# Patient Record
Sex: Female | Born: 1965 | Race: White | Hispanic: No | Smoking: Former smoker
Health system: Southern US, Community
[De-identification: ages and names within clinical notes are randomized; demographics above are authoritative.]

## PROBLEM LIST (undated history)

## (undated) DIAGNOSIS — E785 Hyperlipidemia, unspecified: Secondary | ICD-10-CM

## (undated) DIAGNOSIS — F324 Major depressive disorder, single episode, in partial remission: Secondary | ICD-10-CM

## (undated) DIAGNOSIS — T7840XA Allergy, unspecified, initial encounter: Secondary | ICD-10-CM

## (undated) DIAGNOSIS — F419 Anxiety disorder, unspecified: Secondary | ICD-10-CM

## (undated) DIAGNOSIS — J45909 Unspecified asthma, uncomplicated: Secondary | ICD-10-CM

## (undated) DIAGNOSIS — F32A Depression, unspecified: Secondary | ICD-10-CM

## (undated) DIAGNOSIS — F329 Major depressive disorder, single episode, unspecified: Secondary | ICD-10-CM

## (undated) DIAGNOSIS — I10 Essential (primary) hypertension: Secondary | ICD-10-CM

## (undated) DIAGNOSIS — E119 Type 2 diabetes mellitus without complications: Secondary | ICD-10-CM

## (undated) HISTORY — PX: TOTAL ABDOMINAL HYSTERECTOMY: SHX209

## (undated) HISTORY — DX: Allergy, unspecified, initial encounter: T78.40XA

## (undated) HISTORY — PX: ABDOMINAL HYSTERECTOMY: SHX81

## (undated) HISTORY — PX: OOPHORECTOMY: SHX86

---

## 2011-07-05 LAB — HM COLONOSCOPY

## 2015-06-23 LAB — LIPID PANEL
Cholesterol: 172 (ref 0–200)
HDL: 50 (ref 35–70)
LDL CALC: 92
TRIGLYCERIDES: 152 (ref 40–160)

## 2017-10-18 ENCOUNTER — Encounter: Payer: Self-pay | Admitting: Emergency Medicine

## 2017-10-18 ENCOUNTER — Other Ambulatory Visit: Payer: Self-pay

## 2017-10-18 ENCOUNTER — Ambulatory Visit
Admission: EM | Admit: 2017-10-18 | Discharge: 2017-10-18 | Disposition: A | Discharge status: Home / Self Care | Payer: Self-pay | Attending: Family Medicine | Admitting: Family Medicine

## 2017-10-18 DIAGNOSIS — R3915 Urgency of urination: Secondary | ICD-10-CM

## 2017-10-18 DIAGNOSIS — N3001 Acute cystitis with hematuria: Secondary | ICD-10-CM

## 2017-10-18 DIAGNOSIS — R319 Hematuria, unspecified: Secondary | ICD-10-CM

## 2017-10-18 HISTORY — DX: Hyperlipidemia, unspecified: E78.5

## 2017-10-18 HISTORY — DX: Major depressive disorder, single episode, unspecified: F32.9

## 2017-10-18 HISTORY — DX: Essential (primary) hypertension: I10

## 2017-10-18 HISTORY — DX: Type 2 diabetes mellitus without complications: E11.9

## 2017-10-18 HISTORY — DX: Anxiety disorder, unspecified: F41.9

## 2017-10-18 HISTORY — DX: Depression, unspecified: F32.A

## 2017-10-18 LAB — URINALYSIS, COMPLETE (UACMP) WITH MICROSCOPIC
BILIRUBIN URINE: NEGATIVE
GLUCOSE, UA: NEGATIVE mg/dL
LEUKOCYTES UA: NEGATIVE
NITRITE: NEGATIVE
PH: 5.5 (ref 5.0–8.0)
Protein, ur: NEGATIVE mg/dL
SPECIFIC GRAVITY, URINE: 1.02 (ref 1.005–1.030)

## 2017-10-18 MED ORDER — LISINOPRIL 10 MG PO TABS: 10.0000 mg | ORAL_TABLET | Freq: Every day | ORAL | 0 refills | Status: DC

## 2017-10-18 MED ORDER — PRAVASTATIN SODIUM 40 MG PO TABS: 40.0000 mg | ORAL_TABLET | Freq: Every day | ORAL | 0 refills | Status: DC

## 2017-10-18 MED ORDER — NITROFURANTOIN MONOHYD MACRO 100 MG PO CAPS: 100.0000 mg | ORAL_CAPSULE | Freq: Two times a day (BID) | ORAL | 0 refills | Status: DC

## 2017-10-18 MED ORDER — ALBUTEROL SULFATE HFA 108 (90 BASE) MCG/ACT IN AERS: 1.0000 | INHALATION_SPRAY | Freq: Four times a day (QID) | RESPIRATORY_TRACT | 0 refills | Status: DC | PRN

## 2017-10-18 MED ORDER — ESCITALOPRAM OXALATE 20 MG PO TABS: 20.0000 mg | ORAL_TABLET | Freq: Every day | ORAL | 0 refills | Status: DC

## 2017-10-18 MED ORDER — SEMAGLUTIDE(0.25 OR 0.5MG/DOS) 2 MG/1.5ML ~~LOC~~ SOPN: 0.2500 mg | PEN_INJECTOR | SUBCUTANEOUS | 1 refills | Status: DC

## 2017-10-18 MED ORDER — BUPROPION HCL ER (XL) 300 MG PO TB24: 300.0000 mg | ORAL_TABLET | Freq: Every day | ORAL | 0 refills | Status: DC

## 2017-10-18 NOTE — ED Triage Notes (Signed)
Patient in with a 1 week history of urinary urgency. Today she has had hematuria.

## 2017-10-18 NOTE — ED Provider Notes (Signed)
[facility]   CSN: [id number] Arrival date & time: 10/18/17  1402   History   Chief Complaint Chief Complaint  Patient presents with  . Urinary Urgency   HPI  52 year old female presents with concerns for UTI.  Patient reports that last week she had urinary urgency.  She took some supplements with cranberry and had improvement.  Today, however she developed hematuria.  She still has some mild urgency.  No dysuria.  No abdominal pain.  Does note some low back pain.  No fevers or chills.  No flank pain.  No other associated symptoms.  No other complaints concerns at this time.  Past Medical History:  Diagnosis Date  . Anxiety   . Depression   . Diabetes mellitus without complication (HCC)   . Hyperlipidemia   . Hypertension    Past Surgical History:  Procedure Laterality Date  . ABDOMINAL HYSTERECTOMY     OB History   None    Home Medications    Prior to Admission medications   Medication Sig Start Date End Date Taking? Authorizing Provider  OZEMPIC 0.25 or 0.5 MG/DOSE SOPN INJECT 0.25MG  EVERY WEEK FOR 4 WEEKS 07/12/17  Yes [provider]  albuterol (PROVENTIL HFA;VENTOLIN HFA) 108 (90 Base) MCG/ACT inhaler Inhale 1-2 puffs into the lungs every 6 (six) hours as needed for wheezing or shortness of breath. 10/18/17   Tommie Sams, DO  buPROPion (WELLBUTRIN XL) 300 MG 24 hr tablet Take 1 tablet (300 mg total) by mouth daily. 10/18/17   Tommie Sams, DO  escitalopram (LEXAPRO) 20 MG tablet Take 1 tablet (20 mg total) by mouth daily. 10/18/17   Tommie Sams, DO  lisinopril (PRINIVIL,ZESTRIL) 10 MG tablet Take 1 tablet (10 mg total) by mouth daily. 10/18/17   Tommie Sams, DO  nitrofurantoin, macrocrystal-monohydrate, (MACROBID) 100 MG capsule Take 1 capsule (100 mg total) by mouth 2 (two) times daily. 10/18/17   Tommie Sams, DO  pravastatin (PRAVACHOL) 40 MG tablet Take 1 tablet (40 mg total) by mouth daily. 10/18/17   Tommie Sams, DO  Semaglutide (OZEMPIC)  0.25 or 0.5 MG/DOSE SOPN Inject 0.25 mg into the skin once a week. 10/18/17   Tommie Sams, DO    Family History Family History  Problem Relation Age of Onset  . Hyperlipidemia Mother   . Other Father        Progressive Supranuclear Palsy  . Hypertension Father   . Hyperlipidemia Father   . Diabetes Paternal Grandmother     Social History Social History   Tobacco Use  . Smoking status: Former Smoker    Last attempt to quit: 10/18/2005    Years since quitting: 12.0  . Smokeless tobacco: Never Used  Substance Use Topics  . Alcohol use: Never    Frequency: Never  . Drug use: Never     Allergies   Patient has no known allergies.   Review of Systems Review of Systems Per HPI  Physical Exam Triage Vital Signs ED Triage Vitals  Enc Vitals Group     BP 10/18/17 1428 123/83     Pulse Rate 10/18/17 1428 78     Resp 10/18/17 1428 16     Temp 10/18/17 1428 98 F (36.7 C)     Temp Source 10/18/17 1428 Oral     SpO2 10/18/17 1428 98 %     Weight 10/18/17 1427 194 lb (88 kg)     Height 10/18/17 1427 5\' 5"  (1.651 m)  Head Circumference --      Peak Flow --      Pain Score 10/18/17 1427 0     Pain Loc --      Pain Edu? --      Excl. in GC? --    Updated Vital Signs BP 123/83 (BP Location: Left Arm)   Pulse 78   Temp 98 F (36.7 C) (Oral)   Resp 16   Ht 5\' 5"  (1.651 m)   Wt 194 lb (88 kg)   SpO2 98%   BMI 32.28 kg/m   Physical Exam  Constitutional: She is oriented to person, place, and time. She appears well-developed. No distress.  Cardiovascular: Normal rate and regular rhythm.  Pulmonary/Chest: Effort normal and breath sounds normal. She has no wheezes. She has no rales.  Abdominal: Soft. She exhibits no distension. There is no tenderness.  Neurological: She is alert and oriented to person, place, and time.  Psychiatric: She has a normal mood and affect. Her behavior is normal.  Nursing note and vitals reviewed.  UC Treatments / Results  Labs (all  labs ordered are listed, but only abnormal results are displayed) Labs Reviewed  URINALYSIS, COMPLETE (UACMP) WITH MICROSCOPIC - Abnormal; Notable for the following components:      Result Value   APPearance CLOUDY (*)    Hgb urine dipstick MODERATE (*)    Ketones, ur TRACE (*)    Squamous Epithelial / LPF 0-5 (*)    Bacteria, UA RARE (*)    All other components within normal limits  URINE CULTURE    EKG None Radiology No results found.  Procedures Procedures (including critical care time)  Medications Ordered in UC Medications - No data to display   Initial Impression / Assessment and Plan / UC Course  I have reviewed the triage vital signs and the nursing notes.  Pertinent labs & imaging results that were available during my care of the patient were reviewed by me and considered in my medical decision making (see chart for details).     52 year old female presents for UTI.  Sending culture.  Treating with Macrobid.  Patient requested medication refills as she has no more refills from her primary and just moved here in January.  Refills given.  Final Clinical Impressions(s) / UC Diagnoses   Final diagnoses:  Acute cystitis with hematuria    ED Discharge Orders        Ordered    escitalopram (LEXAPRO) 20 MG tablet  Daily     10/18/17 1448    buPROPion (WELLBUTRIN XL) 300 MG 24 hr tablet  Daily     10/18/17 1448    lisinopril (PRINIVIL,ZESTRIL) 10 MG tablet  Daily     10/18/17 1448    pravastatin (PRAVACHOL) 40 MG tablet  Daily     10/18/17 1448    albuterol (PROVENTIL HFA;VENTOLIN HFA) 108 (90 Base) MCG/ACT inhaler  Every 6 hours PRN     10/18/17 1448    Semaglutide (OZEMPIC) 0.25 or 0.5 MG/DOSE SOPN  Weekly     10/18/17 1450    nitrofurantoin, macrocrystal-monohydrate, (MACROBID) 100 MG capsule  2 times daily     10/18/17 1459     Controlled Substance Prescriptions [place] Controlled Substance Registry consulted? Not Applicable   Tommie SamsCook, Shauntell Iglesia G, DO 10/18/17  [id number]

## 2017-10-20 ENCOUNTER — Telehealth: Payer: Self-pay

## 2017-10-20 ENCOUNTER — Ambulatory Visit (INDEPENDENT_AMBULATORY_CARE_PROVIDER_SITE_OTHER): Payer: Self-pay

## 2017-10-20 ENCOUNTER — Ambulatory Visit
Admission: EM | Admit: 2017-10-20 | Discharge: 2017-10-20 | Disposition: A | Discharge status: Home / Self Care | Payer: Self-pay | Attending: Family Medicine | Admitting: Family Medicine

## 2017-10-20 ENCOUNTER — Other Ambulatory Visit: Payer: Self-pay

## 2017-10-20 DIAGNOSIS — R310 Gross hematuria: Secondary | ICD-10-CM

## 2017-10-20 DIAGNOSIS — R109 Unspecified abdominal pain: Secondary | ICD-10-CM

## 2017-10-20 DIAGNOSIS — R300 Dysuria: Secondary | ICD-10-CM

## 2017-10-20 LAB — URINALYSIS, COMPLETE (UACMP) WITH MICROSCOPIC
Bacteria, UA: NONE SEEN
Bilirubin Urine: NEGATIVE
Glucose, UA: NEGATIVE mg/dL
Ketones, ur: NEGATIVE mg/dL
Leukocytes, UA: NEGATIVE
NITRITE: NEGATIVE
PH: 7.5 (ref 5.0–8.0)
Protein, ur: NEGATIVE mg/dL
SPECIFIC GRAVITY, URINE: 1.015 (ref 1.005–1.030)
WBC, UA: NONE SEEN WBC/hpf (ref 0–5)

## 2017-10-20 LAB — URINE CULTURE

## 2017-10-20 NOTE — Telephone Encounter (Signed)
Attempt to return call from pt who reports she is not getting better on the ABX and having more blood in urine. I left a VMM with our return contact information

## 2017-10-20 NOTE — ED Triage Notes (Signed)
Pt reports she is taking Macrobid for UTI but sx getting worse instead of better. Urine culture was inconclusive. Notices more blood in her urine and burning with urination

## 2017-10-20 NOTE — ED Provider Notes (Signed)
[facility]    CSN: [id number] Arrival date & time: 10/20/17  1226   History   Chief Complaint Chief Complaint  Patient presents with  . Hematuria   HPI  52 year old female presents with persistent hematuria.   Patient was recently seen on 4/18.  She had urinary symptoms and a urinalysis that was suggestive of UTI.  She was placed on Macrobid.  Her culture was negative.  She presents today with continued hematuria.  She continues to have mild burning with urination.  She said some suprapubic pain as well.  Her discomfort is mild in severity.  She is apparently concerned about the fact that she continues to have hematuria.  No known exacerbating factors.  She has had no relief with the Macrobid.  No current back pain or flank pain.  No other associated symptoms.  Past Medical History:  Diagnosis Date  . Anxiety   . Depression   . Diabetes mellitus without complication (HCC)   . Hyperlipidemia   . Hypertension    Past Surgical History:  Procedure Laterality Date  . ABDOMINAL HYSTERECTOMY      OB History   None      Home Medications    Prior to Admission medications   Medication Sig Start Date End Date Taking? Authorizing Provider  albuterol (PROVENTIL HFA;VENTOLIN HFA) 108 (90 Base) MCG/ACT inhaler Inhale 1-2 puffs into the lungs every 6 (six) hours as needed for wheezing or shortness of breath. 10/18/17   Tommie Sams, DO  buPROPion (WELLBUTRIN XL) 300 MG 24 hr tablet Take 1 tablet (300 mg total) by mouth daily. 10/18/17   Tommie Sams, DO  escitalopram (LEXAPRO) 20 MG tablet Take 1 tablet (20 mg total) by mouth daily. 10/18/17   Tommie Sams, DO  lisinopril (PRINIVIL,ZESTRIL) 10 MG tablet Take 1 tablet (10 mg total) by mouth daily. 10/18/17   Tommie Sams, DO  nitrofurantoin, macrocrystal-monohydrate, (MACROBID) 100 MG capsule Take 1 capsule (100 mg total) by mouth 2 (two) times daily. 10/18/17   Volanda Mangine G, DO  OZEMPIC 0.25 or 0.5 MG/DOSE SOPN INJECT  0.25MG  EVERY WEEK FOR 4 WEEKS 07/12/17   [provider]  pravastatin (PRAVACHOL) 40 MG tablet Take 1 tablet (40 mg total) by mouth daily. 10/18/17   Tommie Sams, DO  Semaglutide (OZEMPIC) 0.25 or 0.5 MG/DOSE SOPN Inject 0.25 mg into the skin once a week. 10/18/17   Tommie Sams, DO    Family History Family History  Problem Relation Age of Onset  . Hyperlipidemia Mother   . Other Father        Progressive Supranuclear Palsy  . Hypertension Father   . Hyperlipidemia Father   . Diabetes Paternal Grandmother     Social History Social History   Tobacco Use  . Smoking status: Former Smoker    Last attempt to quit: 10/18/2005    Years since quitting: 12.0  . Smokeless tobacco: Never Used  Substance Use Topics  . Alcohol use: Never    Frequency: Never  . Drug use: Never     Allergies   Patient has no known allergies.   Review of Systems Review of Systems  Constitutional: Negative.   Gastrointestinal: Positive for abdominal pain.  Genitourinary: Positive for dysuria and hematuria. Negative for flank pain.   Physical Exam Triage Vital Signs ED Triage Vitals  Enc Vitals Group     BP 10/20/17 1233 (!) 152/93     Pulse Rate 10/20/17 1233 71  Resp 10/20/17 1233 16     Temp 10/20/17 1233 98.1 F (36.7 C)     Temp Source 10/20/17 1233 Oral     SpO2 10/20/17 1233 99 %     Weight 10/20/17 1236 194 lb (88 kg)     Height 10/20/17 1236 5\' 5"  (1.651 m)     Head Circumference --      Peak Flow --      Pain Score 10/20/17 1235 1     Pain Loc --      Pain Edu? --      Excl. in GC? --    Updated Vital Signs BP (!) 152/93 (BP Location: Right Arm)   Pulse 71   Temp 98.1 F (36.7 C) (Oral)   Resp 16   Ht 5\' 5"  (1.651 m)   Wt 194 lb (88 kg)   SpO2 99%   BMI 32.28 kg/m   Physical Exam  Constitutional: She is oriented to person, place, and time. She appears well-developed. No distress.  Pulmonary/Chest: Effort normal. No respiratory distress.  Abdominal: Soft.  She exhibits no distension. There is no tenderness.  Neurological: She is alert and oriented to person, place, and time.  Psychiatric: She has a normal mood and affect. Her behavior is normal.  Nursing note and vitals reviewed.  UC Treatments / Results  Labs (all labs ordered are listed, but only abnormal results are displayed) Labs Reviewed  URINALYSIS, COMPLETE (UACMP) WITH MICROSCOPIC - Abnormal; Notable for the following components:      Result Value   Hgb urine dipstick MODERATE (*)    Squamous Epithelial / LPF 0-5 (*)    All other components within normal limits    EKG None Radiology Ct Renal Stone Study  Result Date: 10/20/2017 CLINICAL DATA:  Hematuria and dysuria. EXAM: CT ABDOMEN AND PELVIS WITHOUT CONTRAST TECHNIQUE: Multidetector CT imaging of the abdomen and pelvis was performed following the standard protocol without IV contrast. COMPARISON:  None. FINDINGS: Lower chest: Clear lung bases. Hepatobiliary: No focal liver abnormality is seen. No gallstones, gallbladder wall thickening, or biliary dilatation. Pancreas: Unremarkable. Spleen: Unremarkable. Adrenals/Urinary Tract: Unremarkable adrenal glands. No evidence of renal mass, calculi, or hydronephrosis. No ureteral calculi or ureteral dilatation. Grossly unremarkable bladder allowing for underdistention. Stomach/Bowel: The stomach is within normal limits. There is no evidence of bowel obstruction. Mild sigmoid colon diverticulosis is noted without evidence of diverticulitis. The appendix is unremarkable. Vascular/Lymphatic: Mild abdominal aortic atherosclerosis without aneurysm. No enlarged lymph nodes. Reproductive: Status post hysterectomy. Unremarkable right ovary. Nonvisualization of the left ovary, possibly surgically absent. Other: No intraperitoneal free fluid.  No abdominal wall hernia. Musculoskeletal: No acute osseous abnormality or suspicious osseous lesion. Mild lumbar disc and facet degeneration with trace  retrolisthesis of L3 on L4. IMPRESSION: 1. No urinary tract calculi, hydronephrosis, or other acute abnormality identified. 2.  Aortic Atherosclerosis (ICD10-I70.0). Electronically Signed   By: Sebastian Ache M.D.   On: 10/20/2017 14:18    Procedures Procedures (including critical care time)  Medications Ordered in UC Medications - No data to display   Initial Impression / Assessment and Plan / UC Course  I have reviewed the triage vital signs and the nursing notes.  Pertinent labs & imaging results that were available during my care of the patient were reviewed by me and considered in my medical decision making (see chart for details).     52 year old female presents with gross hematuria.  Associated mild he has dysuria and has had some suprapubic  pain.  Her recent urine culture was negative.  Stopping antibiotic.  CT was obtained today as a part of workup for gross hematuria without obvious cause.  CT was negative.  Advised to call urology on Monday.  Final Clinical Impressions(s) / UC Diagnoses   Final diagnoses:  Gross hematuria    ED Discharge Orders    None     Controlled Substance Prescriptions [place] Controlled Substance Registry consulted? Not Applicable   Tommie SamsCook, Asher Babilonia G, DO 10/20/17 1439

## 2017-10-20 NOTE — Discharge Instructions (Signed)
CT negative.  Culture negative.  Stop antibiotic.  Call Urology Monday.  Take care  Dr. Adriana Simasook

## 2017-10-22 ENCOUNTER — Ambulatory Visit: Payer: Self-pay

## 2017-10-25 LAB — HM DIABETES EYE EXAM

## 2017-11-09 ENCOUNTER — Other Ambulatory Visit: Payer: Self-pay | Admitting: Family Medicine

## 2017-11-20 ENCOUNTER — Ambulatory Visit: Payer: Self-pay | Admitting: Internal Medicine

## 2017-11-23 ENCOUNTER — Ambulatory Visit: Payer: Self-pay | Admitting: Urology

## 2017-12-05 ENCOUNTER — Ambulatory Visit: Payer: Self-pay | Admitting: Internal Medicine

## 2018-01-04 ENCOUNTER — Other Ambulatory Visit: Payer: Self-pay | Admitting: Internal Medicine

## 2018-01-07 ENCOUNTER — Ambulatory Visit (INDEPENDENT_AMBULATORY_CARE_PROVIDER_SITE_OTHER): Payer: Self-pay | Admitting: Internal Medicine

## 2018-01-07 ENCOUNTER — Other Ambulatory Visit: Payer: Self-pay

## 2018-01-07 ENCOUNTER — Encounter: Payer: Self-pay | Admitting: Internal Medicine

## 2018-01-07 VITALS — BP 122/88 | HR 76 | Resp 16 | Ht 65.0 in | Wt 201.0 lb

## 2018-01-07 DIAGNOSIS — J4520 Mild intermittent asthma, uncomplicated: Secondary | ICD-10-CM

## 2018-01-07 DIAGNOSIS — E785 Hyperlipidemia, unspecified: Secondary | ICD-10-CM

## 2018-01-07 DIAGNOSIS — I10 Essential (primary) hypertension: Secondary | ICD-10-CM

## 2018-01-07 DIAGNOSIS — F411 Generalized anxiety disorder: Secondary | ICD-10-CM

## 2018-01-07 DIAGNOSIS — E1169 Type 2 diabetes mellitus with other specified complication: Secondary | ICD-10-CM

## 2018-01-07 DIAGNOSIS — E119 Type 2 diabetes mellitus without complications: Secondary | ICD-10-CM

## 2018-01-07 DIAGNOSIS — F324 Major depressive disorder, single episode, in partial remission: Secondary | ICD-10-CM

## 2018-01-07 MED ORDER — LISINOPRIL 10 MG PO TABS: 10.0000 mg | ORAL_TABLET | Freq: Every day | ORAL | 1 refills | Status: DC

## 2018-01-07 MED ORDER — METFORMIN HCL ER 500 MG PO TB24: 500.0000 mg | ORAL_TABLET | Freq: Every day | ORAL | 1 refills | Status: DC

## 2018-01-07 MED ORDER — PRAVASTATIN SODIUM 40 MG PO TABS: 40.0000 mg | ORAL_TABLET | Freq: Every day | ORAL | 1 refills | Status: DC

## 2018-01-07 MED ORDER — BUPROPION HCL ER (XL) 300 MG PO TB24: 300.0000 mg | ORAL_TABLET | Freq: Every day | ORAL | 1 refills | Status: DC

## 2018-01-07 MED ORDER — ESCITALOPRAM OXALATE 20 MG PO TABS: 20.0000 mg | ORAL_TABLET | Freq: Every day | ORAL | 1 refills | Status: DC

## 2018-01-07 NOTE — Progress Notes (Signed)
Date:  01/07/2018   Name:  Amanda Bautista   DOB:  09-12-1965   MRN:  [id number]  Pt moved here in January.  Had check up in [place] with PCP, labs were normal.  Mammogram done last fall.  She has had several colonoscopies.  Pap not needed due to hysterectomy.  Chief Complaint: New patient Hypertension  This is a chronic problem. The current episode started more than 1 year ago. The problem is unchanged. The problem is controlled. Associated symptoms include anxiety. Pertinent negatives include no chest pain, headaches, palpitations or shortness of breath. Risk factors for coronary artery disease include diabetes mellitus and dyslipidemia. There is no history of retinopathy.  Hyperlipidemia  This is a chronic problem. The problem is controlled. Pertinent negatives include no chest pain or shortness of breath. Current antihyperlipidemic treatment includes statins. The current treatment provides significant improvement of lipids. Risk factors for coronary artery disease include diabetes mellitus and dyslipidemia.  Depression         This is a chronic problem.  Episode onset: many years ago - more than 19 years.   The onset quality is undetermined.   Progression since onset: had a severe bout last year but worked through issues with counseling and psychiatic care.  Associated symptoms include insomnia (intermittent).  Associated symptoms include no fatigue, no appetite change, no headaches and no suicidal ideas.  Past treatments include SSRIs - Selective serotonin reuptake inhibitors and other medications (bupropion).  Compliance with treatment is good.  Previous treatment provided significant relief.  Past medical history includes anxiety.   Diabetes  She presents for her follow-up diabetic visit. She has type 2 diabetes mellitus. Her disease course has been stable. Hypoglycemia symptoms include nervousness/anxiousness. Pertinent negatives for hypoglycemia include no headaches or tremors. Pertinent negatives  for diabetes include no chest pain, no fatigue, no polydipsia and no polyuria. Symptoms are stable. Pertinent negatives for diabetic complications include no nephropathy, peripheral neuropathy or retinopathy. Current diabetic treatments: ozempic. She is compliant with treatment most of the time. Her weight is stable. There is no compliance with monitoring of blood glucose. An ACE inhibitor/angiotensin II receptor blocker is being taken. Eye exam is current.  Anxiety  Presents for follow-up visit. Symptoms include insomnia (intermittent) and nervous/anxious behavior. Patient reports no chest pain, palpitations, shortness of breath or suicidal ideas. Symptoms occur occasionally. The severity of symptoms is moderate. The quality of sleep is fair.   Her past medical history is significant for asthma.  Asthma  She complains of wheezing. There is no cough or shortness of breath. This is a recurrent problem. The problem occurs intermittently. Pertinent negatives include no appetite change, chest pain, fever, headaches or trouble swallowing. Her symptoms are aggravated by change in weather and pollen. Her symptoms are alleviated by beta-agonist and steroid inhaler (using inhalers prn). She reports significant improvement on treatment. Her past medical history is significant for asthma.      Review of Systems  Constitutional: Negative for appetite change, fatigue, fever and unexpected weight change.  HENT: Negative for tinnitus and trouble swallowing.   Eyes: Negative for visual disturbance.  Respiratory: Positive for wheezing. Negative for cough, chest tightness and shortness of breath.   Cardiovascular: Negative for chest pain, palpitations and leg swelling.  Gastrointestinal: Negative for abdominal pain.  Endocrine: Negative for polydipsia and polyuria.  Genitourinary: Negative for dysuria and hematuria.  Musculoskeletal: Negative for arthralgias.  Skin: Negative for color change and rash.    Neurological: Negative for tremors,  numbness and headaches.  Hematological: Negative for adenopathy.  Psychiatric/Behavioral: Positive for depression. Negative for dysphoric mood and suicidal ideas. The patient is nervous/anxious and has insomnia (intermittent).     There are no active problems to display for this patient.   Prior to Admission medications   Medication Sig Start Date End Date Taking? Authorizing Provider  albuterol (PROVENTIL HFA;VENTOLIN HFA) 108 (90 Base) MCG/ACT inhaler Inhale 1-2 puffs into the lungs every 6 (six) hours as needed for wheezing or shortness of breath. 10/18/17  Yes Cook, Jayce G, DO  ALPRAZolam (NIRAVAM) 0.25 MG dissolvable tablet Take 0.25 mg by mouth at bedtime as needed for anxiety.   Yes [provider]  buPROPion (WELLBUTRIN XL) 300 MG 24 hr tablet Take 1 tablet (300 mg total) by mouth daily. 10/18/17  Yes Cook, Jayce G, DO  escitalopram (LEXAPRO) 20 MG tablet Take 1 tablet (20 mg total) by mouth daily. 10/18/17  Yes Cook, Jayce G, DO  lisinopril (PRINIVIL,ZESTRIL) 10 MG tablet Take 1 tablet (10 mg total) by mouth daily. 10/18/17  Yes Cook, Jayce G, DO  Mometasone Furoate [facility](ASMANEX HFA IN) Inhale into the lungs.   Yes [provider]  pravastatin (PRAVACHOL) 40 MG tablet Take 1 tablet (40 mg total) by mouth daily. 10/18/17  Yes Cook, Jayce G, DO  Semaglutide (OZEMPIC) 0.25 or 0.5 MG/DOSE SOPN Inject 0.25 mg into the skin once a week. 10/18/17  Yes Tommie Samsook, Jayce G, DO    No Known Allergies  Past Surgical History:  Procedure Laterality Date  . ABDOMINAL HYSTERECTOMY      Social History   Tobacco Use  . Smoking status: Former Smoker    Last attempt to quit: 10/18/2005    Years since quitting: 12.2  . Smokeless tobacco: Never Used  Substance Use Topics  . Alcohol use: Never    Frequency: Never  . Drug use: Never     Medication list has been reviewed and updated.  Current Meds  Medication Sig  . albuterol (PROVENTIL  HFA;VENTOLIN HFA) 108 (90 Base) MCG/ACT inhaler Inhale 1-2 puffs into the lungs every 6 (six) hours as needed for wheezing or shortness of breath.  . ALPRAZolam (NIRAVAM) 0.25 MG dissolvable tablet Take 0.25 mg by mouth at bedtime as needed for anxiety.  Marland Kitchen. buPROPion (WELLBUTRIN XL) 300 MG 24 hr tablet Take 1 tablet (300 mg total) by mouth daily.  Marland Kitchen. escitalopram (LEXAPRO) 20 MG tablet Take 1 tablet (20 mg total) by mouth daily.  Marland Kitchen. lisinopril (PRINIVIL,ZESTRIL) 10 MG tablet Take 1 tablet (10 mg total) by mouth daily.  . Mometasone Furoate (ASMANEX HFA IN) Inhale into the lungs.  . pravastatin (PRAVACHOL) 40 MG tablet Take 1 tablet (40 mg total) by mouth daily.  . Semaglutide (OZEMPIC) 0.25 or 0.5 MG/DOSE SOPN Inject 0.25 mg into the skin once a week.    PHQ 2/9 Scores 01/07/2018  PHQ - 2 Score 0  PHQ- 9 Score 3  Exception Documentation Medical reason    Physical Exam  Constitutional: She is oriented to person, place, and time. She appears well-developed. No distress.  HENT:  Head: Normocephalic and atraumatic.  Neck: Normal range of motion. Neck supple.  Cardiovascular: Normal rate, regular rhythm and normal heart sounds.  Pulmonary/Chest: Effort normal and breath sounds normal. No respiratory distress.  Abdominal: Bowel sounds are normal. She exhibits no mass. There is no tenderness. There is no guarding.  Musculoskeletal: Normal range of motion.  Neurological: She is alert and oriented to person, place, and  time.  Skin: Skin is warm and dry. No rash noted.  Psychiatric: She has a normal mood and affect. Her behavior is normal. Thought content normal.  Nursing note and vitals reviewed.   BP 122/88 (BP Location: Left Arm, Patient Position: Sitting, Cuff Size: Normal)   Pulse 76   Resp 16   Ht 5\' 5"  (1.651 m)   Wt 201 lb (91.2 kg)   SpO2 97%   BMI 33.45 kg/m   Assessment and Plan: 1. Type 2 diabetes mellitus without complication, without long-term current use of insulin  (HCC) Stop ozempic and begin metforminER - Hemoglobin A1c - Basic metabolic panel - metFORMIN (GLUCOPHAGE-XR) 500 MG 24 hr tablet; Take 1 tablet (500 mg total) by mouth daily with breakfast.  Dispense: 90 tablet; Refill: 1  2. Essential hypertension Controlled Continue ACE for renal protection - lisinopril (PRINIVIL,ZESTRIL) 10 MG tablet; Take 1 tablet (10 mg total) by mouth daily.  Dispense: 90 tablet; Refill: 1  3. Hyperlipidemia associated with type 2 diabetes mellitus (HCC) On appropriate statin therapy - pravastatin (PRAVACHOL) 40 MG tablet; Take 1 tablet (40 mg total) by mouth daily.  Dispense: 90 tablet; Refill: 1  4. Major depressive disorder in partial remission, unspecified whether recurrent (HCC) Stable at present - recommend psychiatric follow up and care - escitalopram (LEXAPRO) 20 MG tablet; Take 1 tablet (20 mg total) by mouth daily.  Dispense: 90 tablet; Refill: 1  5. Mild intermittent asthma without complication Use steroid and albuterol inhalers PRN  6. Generalized anxiety disorder Uses xanax about every other day - does not need refill now - buPROPion (WELLBUTRIN XL) 300 MG 24 hr tablet; Take 1 tablet (300 mg total) by mouth daily.  Dispense: 90 tablet; Refill: 1   Meds ordered this encounter  Medications  . pravastatin (PRAVACHOL) 40 MG tablet    Sig: Take 1 tablet (40 mg total) by mouth daily.    Dispense:  90 tablet    Refill:  1  . buPROPion (WELLBUTRIN XL) 300 MG 24 hr tablet    Sig: Take 1 tablet (300 mg total) by mouth daily.    Dispense:  90 tablet    Refill:  1  . escitalopram (LEXAPRO) 20 MG tablet    Sig: Take 1 tablet (20 mg total) by mouth daily.    Dispense:  90 tablet    Refill:  1  . lisinopril (PRINIVIL,ZESTRIL) 10 MG tablet    Sig: Take 1 tablet (10 mg total) by mouth daily.    Dispense:  90 tablet    Refill:  1  . metFORMIN (GLUCOPHAGE-XR) 500 MG 24 hr tablet    Sig: Take 1 tablet (500 mg total) by mouth daily with breakfast.     Dispense:  90 tablet    Refill:  1    Partially dictated using Animal nutritionist. Any errors are unintentional.  Bari Edward, MD [facility]  01/07/2018

## 2018-01-07 NOTE — Patient Instructions (Signed)
Check on these places for counseling and psychiatric care:  [facility]  [facility] [place]

## 2018-01-08 LAB — BASIC METABOLIC PANEL
BUN/Creatinine Ratio: 21 (ref 9–23)
BUN: 15 mg/dL (ref 6–24)
CO2: 28 mmol/L (ref 20–29)
CREATININE: 0.71 mg/dL (ref 0.57–1.00)
Calcium: 9.6 mg/dL (ref 8.7–10.2)
Chloride: 100 mmol/L (ref 96–106)
GFR calc Af Amer: 112 mL/min/{1.73_m2} (ref >59)
GFR, EST NON AFRICAN AMERICAN: 98 mL/min/{1.73_m2} (ref >59)
Glucose: 97 mg/dL (ref 65–99)
Potassium: 4.2 mmol/L (ref 3.5–5.2)
Sodium: 143 mmol/L (ref 134–144)

## 2018-01-08 LAB — HEMOGLOBIN A1C
Est. average glucose Bld gHb Est-mCnc: 128 mg/dL
Hgb A1c MFr Bld: 6.1 % — ABNORMAL HIGH (ref 4.8–5.6)

## 2018-04-09 ENCOUNTER — Ambulatory Visit: Payer: Self-pay | Admitting: Internal Medicine

## 2018-04-15 ENCOUNTER — Ambulatory Visit: Payer: Self-pay | Admitting: Internal Medicine

## 2018-04-15 DIAGNOSIS — F3341 Major depressive disorder, recurrent, in partial remission: Secondary | ICD-10-CM | POA: Insufficient documentation

## 2018-04-15 DIAGNOSIS — I10 Essential (primary) hypertension: Secondary | ICD-10-CM | POA: Insufficient documentation

## 2018-04-15 DIAGNOSIS — E785 Hyperlipidemia, unspecified: Secondary | ICD-10-CM

## 2018-04-15 DIAGNOSIS — E118 Type 2 diabetes mellitus with unspecified complications: Secondary | ICD-10-CM | POA: Insufficient documentation

## 2018-04-15 DIAGNOSIS — F411 Generalized anxiety disorder: Secondary | ICD-10-CM | POA: Insufficient documentation

## 2018-04-15 DIAGNOSIS — E1169 Type 2 diabetes mellitus with other specified complication: Secondary | ICD-10-CM | POA: Insufficient documentation

## 2018-04-15 DIAGNOSIS — J4520 Mild intermittent asthma, uncomplicated: Secondary | ICD-10-CM | POA: Insufficient documentation

## 2018-04-15 DIAGNOSIS — E1159 Type 2 diabetes mellitus with other circulatory complications: Secondary | ICD-10-CM

## 2018-04-24 ENCOUNTER — Ambulatory Visit (INDEPENDENT_AMBULATORY_CARE_PROVIDER_SITE_OTHER): Payer: Self-pay | Admitting: Internal Medicine

## 2018-04-24 ENCOUNTER — Encounter: Payer: Self-pay | Admitting: Internal Medicine

## 2018-04-24 VITALS — BP 104/80 | HR 85 | Ht 65.0 in | Wt 203.0 lb

## 2018-04-24 DIAGNOSIS — Z1231 Encounter for screening mammogram for malignant neoplasm of breast: Secondary | ICD-10-CM

## 2018-04-24 DIAGNOSIS — Z23 Encounter for immunization: Secondary | ICD-10-CM

## 2018-04-24 DIAGNOSIS — E119 Type 2 diabetes mellitus without complications: Secondary | ICD-10-CM

## 2018-04-24 DIAGNOSIS — F3341 Major depressive disorder, recurrent, in partial remission: Secondary | ICD-10-CM

## 2018-04-24 DIAGNOSIS — J4520 Mild intermittent asthma, uncomplicated: Secondary | ICD-10-CM

## 2018-04-24 DIAGNOSIS — E1169 Type 2 diabetes mellitus with other specified complication: Secondary | ICD-10-CM

## 2018-04-24 DIAGNOSIS — F411 Generalized anxiety disorder: Secondary | ICD-10-CM

## 2018-04-24 DIAGNOSIS — E785 Hyperlipidemia, unspecified: Secondary | ICD-10-CM

## 2018-04-24 DIAGNOSIS — I10 Essential (primary) hypertension: Secondary | ICD-10-CM

## 2018-04-24 DIAGNOSIS — E1159 Type 2 diabetes mellitus with other circulatory complications: Secondary | ICD-10-CM

## 2018-04-24 MED ORDER — BLOOD GLUCOSE MONITOR KIT: PACK | 0 refills | Status: DC

## 2018-04-24 MED ORDER — METFORMIN HCL ER 500 MG PO TB24: 500.0000 mg | ORAL_TABLET | Freq: Every day | ORAL | 5 refills | Status: DC

## 2018-04-24 MED ORDER — ALBUTEROL SULFATE HFA 108 (90 BASE) MCG/ACT IN AERS: 1.0000 | INHALATION_SPRAY | Freq: Four times a day (QID) | RESPIRATORY_TRACT | 0 refills | Status: DC | PRN

## 2018-04-24 MED ORDER — MOMETASONE FUROATE 100 MCG/ACT IN AERO: 1.0000 | INHALATION_SPRAY | Freq: Every day | RESPIRATORY_TRACT | 3 refills | Status: DC

## 2018-04-24 NOTE — Progress Notes (Signed)
Date:  04/24/2018   Name:  Amanda Bautista   DOB:  05-May-1966   MRN:  [id number]   Chief Complaint: Hypertension; Diabetes; Depression; and Immunizations (Reg dose flu shot. )  Hypertension  This is a chronic problem. The problem is unchanged. The problem is controlled. Pertinent negatives include no chest pain, headaches, palpitations or shortness of breath. Past treatments include ACE inhibitors. The current treatment provides significant improvement.  Diabetes  She has type 2 diabetes mellitus. Her disease course has been stable. Pertinent negatives for hypoglycemia include no headaches or tremors. Pertinent negatives for diabetes include no chest pain, no fatigue, no polydipsia and no polyuria. There are no diabetic complications. Current diabetic treatment includes oral agent (monotherapy) (changed from ozempic to metformin). She is compliant with treatment all of the time. Her weight is stable. There is no compliance (not currently checking bs) with monitoring of blood glucose. An ACE inhibitor/angiotensin II receptor blocker is being taken. Eye exam is current.  Depression         This is a chronic problem.  The problem occurs rarely.The problem is unchanged.  Associated symptoms include no fatigue, no appetite change and no headaches.  Past treatments include SSRIs - Selective serotonin reuptake inhibitors and other medications (uses low dose xanax for sleep about 2-3 times per month.).  Compliance with treatment is good.  Previous treatment provided significant relief.  Lab Results  Component Value Date   HGBA1C 6.1 (H) 01/07/2018     Review of Systems  Constitutional: Negative for appetite change, fatigue, fever and unexpected weight change.  HENT: Negative for tinnitus and trouble swallowing.   Eyes: Negative for visual disturbance.  Respiratory: Negative for cough, chest tightness and shortness of breath.   Cardiovascular: Negative for chest pain, palpitations and leg swelling.    Gastrointestinal: Negative for abdominal pain.  Endocrine: Negative for polydipsia and polyuria.  Genitourinary: Negative for dysuria and hematuria.  Musculoskeletal: Negative for arthralgias.  Skin: Negative for color change and rash.  Neurological: Negative for tremors, numbness and headaches.  Psychiatric/Behavioral: Positive for depression and sleep disturbance (occasional). Negative for dysphoric mood.    Patient Active Problem List   Diagnosis Date Noted  . Type 2 diabetes mellitus with vascular disease ([place]) 04/15/2018  . Benign essential HTN 04/15/2018  . Hyperlipidemia associated with type 2 diabetes mellitus ([place]) 04/15/2018  . Depression, major, recurrent, in partial remission ([place]) 04/15/2018  . Generalized anxiety disorder 04/15/2018  . Mild intermittent asthma, uncomplicated 80/09/4915    No Known Allergies  Past Surgical History:  Procedure Laterality Date  . ABDOMINAL HYSTERECTOMY      Social History   Tobacco Use  . Smoking status: Former Smoker    Last attempt to quit: 10/18/2005    Years since quitting: 12.5  . Smokeless tobacco: Never Used  Substance Use Topics  . Alcohol use: Never    Frequency: Never  . Drug use: Never     Medication list has been reviewed and updated.  Current Meds  Medication Sig  . albuterol (PROVENTIL HFA;VENTOLIN HFA) 108 (90 Base) MCG/ACT inhaler Inhale 1-2 puffs into the lungs every 6 (six) hours as needed for wheezing or shortness of breath.  . ALPRAZolam (XANAX) 0.25 MG tablet Take 0.25 mg by mouth at bedtime as needed for anxiety.  Marland Kitchen buPROPion (WELLBUTRIN XL) 300 MG 24 hr tablet Take 1 tablet (300 mg total) by mouth daily.  Marland Kitchen escitalopram (LEXAPRO) 20 MG tablet Take 1 tablet (20 mg total) by  mouth daily.  Marland Kitchen lisinopril (PRINIVIL,ZESTRIL) 10 MG tablet Take 1 tablet (10 mg total) by mouth daily.  . metFORMIN (GLUCOPHAGE-XR) 500 MG 24 hr tablet Take 1 tablet (500 mg total) by mouth daily with breakfast. (Patient taking  differently: Take 500 mg by mouth daily. )  . Mometasone Furoate (ASMANEX HFA IN) Inhale into the lungs.  . pravastatin (PRAVACHOL) 40 MG tablet Take 1 tablet (40 mg total) by mouth daily.    PHQ 2/9 Scores 04/24/2018 01/07/2018  PHQ - 2 Score 0 0  PHQ- 9 Score 0 3  Exception Documentation - Medical reason    Physical Exam  Constitutional: She is oriented to person, place, and time. She appears well-developed. No distress.  HENT:  Head: Normocephalic and atraumatic.  Cardiovascular: Normal rate and regular rhythm.  Pulmonary/Chest: Effort normal and breath sounds normal. No respiratory distress.  Musculoskeletal: Normal range of motion.  Neurological: She is alert and oriented to person, place, and time.  Skin: Skin is warm and dry. No rash noted.  Psychiatric: She has a normal mood and affect. Her behavior is normal. Thought content normal.  Nursing note and vitals reviewed.   BP 104/80 (BP Location: Right Arm, Patient Position: Sitting, Cuff Size: Normal)   Pulse 85   Ht 5' 5"  (1.651 m)   Wt 203 lb (92.1 kg)   SpO2 97%   BMI 33.78 kg/m   Assessment and Plan: 1. Benign essential HTN controlled  2. Type 2 diabetes mellitus with vascular disease ([place]) Doing well on metformin Begin FSBS several times per week - Basic metabolic panel - Hemoglobin A1c - metFORMIN (GLUCOPHAGE-XR) 500 MG 24 hr tablet; Take 1 tablet (500 mg total) by mouth daily.  Dispense: 30 tablet; Refill: 5 - blood glucose meter kit and supplies KIT; Dispense based on patient and insurance preference. Use up to four times daily as directed. (FOR ICD-9 250.00, 250.01).  Dispense: 1 each; Refill: 0  3. Depression, major, recurrent, in partial remission ([place]) Continue current therapy Will prescribe as long as sx are stable  4. Hyperlipidemia associated with type 2 diabetes mellitus ([place]) - Lipid panel  5. Encounter for screening mammogram for breast cancer - MM 3D SCREEN BREAST BILATERAL; Future  6. Mild  intermittent asthma without complication - Mometasone Furoate (ASMANEX HFA) 100 MCG/ACT AERO; Inhale 1 puff into the lungs daily.  Dispense: 1 Inhaler; Refill: 3 - albuterol (PROVENTIL HFA;VENTOLIN HFA) 108 (90 Base) MCG/ACT inhaler; Inhale 1-2 puffs into the lungs every 6 (six) hours as needed for wheezing or shortness of breath.  Dispense: 1 Inhaler; Refill: 0  7. Generalized anxiety disorder May use xanax several times per month PRN   Partially dictated using Editor, commissioning. Any errors are unintentional.  Halina Maidens, MD [facility]  04/24/2018

## 2018-04-25 LAB — LIPID PANEL
Chol/HDL Ratio: 3.9 ratio (ref 0.0–4.4)
Cholesterol, Total: 186 mg/dL (ref 100–199)
HDL: 48 mg/dL (ref >39)
LDL CALC: 112 mg/dL — AB (ref 0–99)
Triglycerides: 131 mg/dL (ref 0–149)
VLDL Cholesterol Cal: 26 mg/dL (ref 5–40)

## 2018-04-25 LAB — BASIC METABOLIC PANEL
BUN / CREAT RATIO: 14 (ref 9–23)
BUN: 10 mg/dL (ref 6–24)
CHLORIDE: 98 mmol/L (ref 96–106)
CO2: 25 mmol/L (ref 20–29)
CREATININE: 0.71 mg/dL (ref 0.57–1.00)
Calcium: 10.1 mg/dL (ref 8.7–10.2)
GFR calc Af Amer: 112 mL/min/{1.73_m2} (ref >59)
GFR calc non Af Amer: 98 mL/min/{1.73_m2} (ref >59)
GLUCOSE: 93 mg/dL (ref 65–99)
Potassium: 4.2 mmol/L (ref 3.5–5.2)
SODIUM: 140 mmol/L (ref 134–144)

## 2018-04-25 LAB — HEMOGLOBIN A1C
ESTIMATED AVERAGE GLUCOSE: 128 mg/dL
HEMOGLOBIN A1C: 6.1 % — AB (ref 4.8–5.6)

## 2018-05-06 ENCOUNTER — Encounter: Payer: Self-pay | Admitting: Internal Medicine

## 2018-05-06 DIAGNOSIS — H35039 Hypertensive retinopathy, unspecified eye: Secondary | ICD-10-CM | POA: Insufficient documentation

## 2018-05-06 DIAGNOSIS — H35033 Hypertensive retinopathy, bilateral: Secondary | ICD-10-CM

## 2018-06-11 ENCOUNTER — Other Ambulatory Visit: Payer: Self-pay | Admitting: Internal Medicine

## 2018-06-11 DIAGNOSIS — E785 Hyperlipidemia, unspecified: Secondary | ICD-10-CM

## 2018-06-11 DIAGNOSIS — E1169 Type 2 diabetes mellitus with other specified complication: Secondary | ICD-10-CM

## 2018-06-11 DIAGNOSIS — F324 Major depressive disorder, single episode, in partial remission: Secondary | ICD-10-CM

## 2018-06-11 NOTE — Telephone Encounter (Signed)
Discuss with pt the results of labs and October and recommendations to start a stronger statin than the pravachol.  I would recommend Crestor 10 mg per day.

## 2018-06-14 ENCOUNTER — Ambulatory Visit
Admission: RE | Admit: 2018-06-14 | Discharge: 2018-06-14 | Disposition: A | Discharge status: Home / Self Care | Payer: Self-pay | Source: Ambulatory Visit | Attending: Internal Medicine | Admitting: Internal Medicine

## 2018-06-14 ENCOUNTER — Other Ambulatory Visit: Payer: Self-pay | Admitting: Internal Medicine

## 2018-06-14 DIAGNOSIS — E1159 Type 2 diabetes mellitus with other circulatory complications: Secondary | ICD-10-CM

## 2018-06-14 DIAGNOSIS — Z1231 Encounter for screening mammogram for malignant neoplasm of breast: Secondary | ICD-10-CM | POA: Insufficient documentation

## 2018-06-14 MED ORDER — METFORMIN HCL ER 500 MG PO TB24: 500.0000 mg | ORAL_TABLET | Freq: Every day | ORAL | 1 refills | Status: DC

## 2018-08-23 ENCOUNTER — Other Ambulatory Visit: Payer: Self-pay | Admitting: Internal Medicine

## 2018-08-23 DIAGNOSIS — I10 Essential (primary) hypertension: Secondary | ICD-10-CM

## 2018-09-12 ENCOUNTER — Other Ambulatory Visit: Payer: Self-pay

## 2018-09-12 DIAGNOSIS — E785 Hyperlipidemia, unspecified: Principal | ICD-10-CM

## 2018-09-12 DIAGNOSIS — F324 Major depressive disorder, single episode, in partial remission: Secondary | ICD-10-CM

## 2018-09-12 DIAGNOSIS — E1169 Type 2 diabetes mellitus with other specified complication: Secondary | ICD-10-CM

## 2018-09-12 MED ORDER — PRAVASTATIN SODIUM 40 MG PO TABS: 40.0000 mg | ORAL_TABLET | Freq: Every day | ORAL | 0 refills | Status: DC

## 2018-09-12 MED ORDER — ESCITALOPRAM OXALATE 20 MG PO TABS: 20.0000 mg | ORAL_TABLET | Freq: Every day | ORAL | 0 refills | Status: DC

## 2018-10-02 ENCOUNTER — Encounter: Payer: Self-pay | Admitting: Internal Medicine

## 2018-10-16 ENCOUNTER — Other Ambulatory Visit: Payer: Self-pay | Admitting: Internal Medicine

## 2018-10-16 DIAGNOSIS — J4520 Mild intermittent asthma, uncomplicated: Secondary | ICD-10-CM

## 2018-11-11 ENCOUNTER — Other Ambulatory Visit: Payer: Self-pay

## 2018-11-11 ENCOUNTER — Encounter: Payer: Self-pay | Admitting: Internal Medicine

## 2018-11-11 ENCOUNTER — Ambulatory Visit (INDEPENDENT_AMBULATORY_CARE_PROVIDER_SITE_OTHER): Payer: Self-pay | Admitting: Internal Medicine

## 2018-11-11 VITALS — BP 109/76 | HR 76 | Temp 95.2°F | Ht 65.0 in | Wt 203.0 lb

## 2018-11-11 DIAGNOSIS — F0781 Postconcussional syndrome: Secondary | ICD-10-CM

## 2018-11-11 DIAGNOSIS — L853 Xerosis cutis: Secondary | ICD-10-CM

## 2018-11-11 MED ORDER — TRIAMCINOLONE ACETONIDE 0.1 % EX CREA: 1.0000 "application " | TOPICAL_CREAM | Freq: Two times a day (BID) | CUTANEOUS | 0 refills | Status: DC
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
## 2018-11-11 NOTE — Patient Instructions (Addendum)
Fish oil 3 gm per day - divided doses  Vitamin D 2000 IU daily  Choline 500 mg per day  Tart Cherry extract or juice ~ 4-6 oz daily

## 2018-11-26 ENCOUNTER — Encounter: Payer: Self-pay | Admitting: Internal Medicine

## 2018-11-28 ENCOUNTER — Encounter: Payer: Self-pay | Admitting: Internal Medicine

## 2018-11-28 ENCOUNTER — Other Ambulatory Visit: Payer: Self-pay | Admitting: Internal Medicine

## 2018-11-28 DIAGNOSIS — M792 Neuralgia and neuritis, unspecified: Secondary | ICD-10-CM

## 2018-11-28 MED ORDER — GABAPENTIN 100 MG PO CAPS: 100.0000 mg | ORAL_CAPSULE | Freq: Every day | ORAL | 0 refills | Status: DC

## 2018-11-28 NOTE — Telephone Encounter (Signed)
Please advise 

## 2018-12-05 ENCOUNTER — Other Ambulatory Visit: Payer: Self-pay | Admitting: Internal Medicine

## 2018-12-05 DIAGNOSIS — F411 Generalized anxiety disorder: Secondary | ICD-10-CM

## 2019-01-14 ENCOUNTER — Encounter: Payer: Self-pay | Admitting: Internal Medicine

## 2019-01-15 ENCOUNTER — Other Ambulatory Visit: Payer: Self-pay | Admitting: Internal Medicine

## 2019-01-15 DIAGNOSIS — E1159 Type 2 diabetes mellitus with other circulatory complications: Secondary | ICD-10-CM

## 2019-01-20 ENCOUNTER — Other Ambulatory Visit: Payer: Self-pay

## 2019-01-20 ENCOUNTER — Ambulatory Visit (INDEPENDENT_AMBULATORY_CARE_PROVIDER_SITE_OTHER): Payer: Self-pay

## 2019-01-20 DIAGNOSIS — Z111 Encounter for screening for respiratory tuberculosis: Secondary | ICD-10-CM

## 2019-01-22 LAB — TB SKIN TEST
Induration: 0 mm
TB Skin Test: NEGATIVE

## 2019-01-29 ENCOUNTER — Encounter: Payer: Self-pay | Admitting: Internal Medicine

## 2019-02-10 ENCOUNTER — Other Ambulatory Visit: Payer: Self-pay | Admitting: Internal Medicine

## 2019-02-10 DIAGNOSIS — M792 Neuralgia and neuritis, unspecified: Secondary | ICD-10-CM

## 2019-02-16 ENCOUNTER — Other Ambulatory Visit: Payer: Self-pay | Admitting: Internal Medicine

## 2019-02-16 DIAGNOSIS — J4520 Mild intermittent asthma, uncomplicated: Secondary | ICD-10-CM

## 2019-03-05 ENCOUNTER — Other Ambulatory Visit: Payer: Self-pay

## 2019-03-05 ENCOUNTER — Ambulatory Visit (INDEPENDENT_AMBULATORY_CARE_PROVIDER_SITE_OTHER): Payer: BC Managed Care – PPO

## 2019-03-05 DIAGNOSIS — Z23 Encounter for immunization: Secondary | ICD-10-CM | POA: Diagnosis not present

## 2019-03-07 ENCOUNTER — Ambulatory Visit: Payer: Self-pay

## 2019-04-12 ENCOUNTER — Other Ambulatory Visit: Payer: Self-pay | Admitting: Internal Medicine

## 2019-04-12 DIAGNOSIS — E785 Hyperlipidemia, unspecified: Secondary | ICD-10-CM

## 2019-04-12 DIAGNOSIS — I10 Essential (primary) hypertension: Secondary | ICD-10-CM

## 2019-04-12 DIAGNOSIS — E1169 Type 2 diabetes mellitus with other specified complication: Secondary | ICD-10-CM

## 2019-04-12 DIAGNOSIS — F324 Major depressive disorder, single episode, in partial remission: Secondary | ICD-10-CM

## 2019-04-13 ENCOUNTER — Other Ambulatory Visit: Payer: Self-pay | Admitting: Internal Medicine

## 2019-04-13 DIAGNOSIS — E1169 Type 2 diabetes mellitus with other specified complication: Secondary | ICD-10-CM

## 2019-04-13 DIAGNOSIS — F324 Major depressive disorder, single episode, in partial remission: Secondary | ICD-10-CM

## 2019-04-23 ENCOUNTER — Encounter: Payer: Self-pay | Admitting: Internal Medicine

## 2019-04-24 ENCOUNTER — Other Ambulatory Visit: Payer: Self-pay | Admitting: Internal Medicine

## 2019-04-24 DIAGNOSIS — J4520 Mild intermittent asthma, uncomplicated: Secondary | ICD-10-CM

## 2019-04-27 ENCOUNTER — Other Ambulatory Visit: Payer: Self-pay | Admitting: Internal Medicine

## 2019-04-27 DIAGNOSIS — J4520 Mild intermittent asthma, uncomplicated: Secondary | ICD-10-CM

## 2019-04-27 MED ORDER — FLOVENT HFA 220 MCG/ACT IN AERO: 1.0000 | INHALATION_SPRAY | Freq: Every day | RESPIRATORY_TRACT | 3 refills | Status: DC

## 2019-05-01 ENCOUNTER — Encounter: Payer: Self-pay | Admitting: Internal Medicine

## 2019-05-01 ENCOUNTER — Ambulatory Visit (INDEPENDENT_AMBULATORY_CARE_PROVIDER_SITE_OTHER): Payer: BC Managed Care – PPO | Admitting: Internal Medicine

## 2019-05-01 ENCOUNTER — Other Ambulatory Visit: Payer: Self-pay

## 2019-05-01 ENCOUNTER — Other Ambulatory Visit: Payer: Self-pay | Admitting: Internal Medicine

## 2019-05-01 VITALS — BP 118/78 | HR 96 | Ht 65.0 in | Wt 209.0 lb

## 2019-05-01 DIAGNOSIS — Z1231 Encounter for screening mammogram for malignant neoplasm of breast: Secondary | ICD-10-CM | POA: Diagnosis not present

## 2019-05-01 DIAGNOSIS — E118 Type 2 diabetes mellitus with unspecified complications: Secondary | ICD-10-CM

## 2019-05-01 DIAGNOSIS — E785 Hyperlipidemia, unspecified: Secondary | ICD-10-CM

## 2019-05-01 DIAGNOSIS — F3341 Major depressive disorder, recurrent, in partial remission: Secondary | ICD-10-CM

## 2019-05-01 DIAGNOSIS — Z23 Encounter for immunization: Secondary | ICD-10-CM | POA: Diagnosis not present

## 2019-05-01 DIAGNOSIS — I10 Essential (primary) hypertension: Secondary | ICD-10-CM

## 2019-05-01 DIAGNOSIS — J4520 Mild intermittent asthma, uncomplicated: Secondary | ICD-10-CM

## 2019-05-01 DIAGNOSIS — Z0000 Encounter for general adult medical examination without abnormal findings: Secondary | ICD-10-CM

## 2019-05-01 DIAGNOSIS — E1169 Type 2 diabetes mellitus with other specified complication: Secondary | ICD-10-CM | POA: Diagnosis not present

## 2019-05-01 LAB — POCT URINALYSIS DIPSTICK
Bilirubin, UA: NEGATIVE
Blood, UA: NEGATIVE
Glucose, UA: NEGATIVE
Ketones, UA: NEGATIVE
Leukocytes, UA: NEGATIVE
Nitrite, UA: NEGATIVE
Protein, UA: NEGATIVE
Spec Grav, UA: 1.015 (ref 1.010–1.025)
Urobilinogen, UA: 0.2 E.U./dL
pH, UA: 6.5 (ref 5.0–8.0)

## 2019-05-01 NOTE — Progress Notes (Signed)
Date:  05/01/2019   Name:  Amanda Bautista   DOB:  June 16, 1966   MRN:  [id number]   Chief Complaint: Annual Exam (Breast Exam. ) and Diabetes (Foot exam and A1C. ) Amanda Bautista is a 53 y.o. female who presents today for her Complete Annual Exam. She feels fairly well. She reports exercising walking the dog. She reports she is sleeping fairly well.   Mammogram  06/2018 Colonoscopy  07/2011 Pap smear - discontinued Immunizations - has Prevnar-13 ?  Diabetes She presents for her follow-up diabetic visit. She has type 2 diabetes mellitus. Her disease course has been stable. Pertinent negatives for hypoglycemia include no dizziness, headaches, nervousness/anxiousness or tremors. Pertinent negatives for diabetes include no chest pain, no fatigue, no polydipsia and no polyuria. Current diabetic treatment includes oral agent (monotherapy). She is compliant with treatment all of the time. She is following a generally healthy diet. An ACE inhibitor/angiotensin II receptor blocker is being taken. Eye exam is not current.  Hypertension This is a chronic problem. The problem is controlled. Pertinent negatives include no chest pain, headaches, palpitations or shortness of breath. Past treatments include ACE inhibitors. The current treatment provides significant improvement. There are no compliance problems.   Hyperlipidemia This is a chronic problem. Pertinent negatives include no chest pain or shortness of breath. Current antihyperlipidemic treatment includes statins. The current treatment provides significant improvement of lipids.  Depression        This is a chronic problem.  The problem has been resolved since onset.  Associated symptoms include no fatigue and no headaches.  Past treatments include SSRIs - Selective serotonin reuptake inhibitors and other medications.  Compliance with treatment is good.  Previous treatment provided significant relief. Asthma There is no cough, shortness of breath or  wheezing. The problem occurs intermittently. Pertinent negatives include no chest pain, fever, headaches or trouble swallowing. Her symptoms are alleviated by steroid inhaler. She reports significant improvement on treatment. Her past medical history is significant for asthma.   Lab Results  Component Value Date   HGBA1C 6.1 (H) 04/24/2018   Lab Results  Component Value Date   CHOL 186 04/24/2018   HDL 48 04/24/2018   LDLCALC 112 (H) 04/24/2018   TRIG 131 04/24/2018   CHOLHDL 3.9 04/24/2018   Lab Results  Component Value Date   CREATININE 0.71 04/24/2018   BUN 10 04/24/2018   NA 140 04/24/2018   K 4.2 04/24/2018   CL 98 04/24/2018   CO2 25 04/24/2018     Review of Systems  Constitutional: Negative for chills, fatigue and fever.  HENT: Negative for congestion, hearing loss, tinnitus, trouble swallowing and voice change.   Eyes: Negative for visual disturbance.  Respiratory: Negative for cough, chest tightness, shortness of breath and wheezing.   Cardiovascular: Negative for chest pain, palpitations and leg swelling.  Gastrointestinal: Negative for abdominal pain, constipation, diarrhea and vomiting.  Endocrine: Negative for polydipsia and polyuria.  Genitourinary: Negative for dysuria, frequency, genital sores, vaginal bleeding and vaginal discharge.  Musculoskeletal: Negative for arthralgias, gait problem and joint swelling.  Skin: Negative for color change and rash.  Neurological: Negative for dizziness, tremors, light-headedness and headaches.  Hematological: Negative for adenopathy. Does not bruise/bleed easily.  Psychiatric/Behavioral: Positive for depression. Negative for dysphoric mood and sleep disturbance. The patient is not nervous/anxious.     Patient Active Problem List   Diagnosis Date Noted  . Xerosis of skin 11/11/2018  . Hypertensive retinopathy 05/06/2018  . Type II diabetes mellitus  with complication ([place]) 11/65/7903  . Benign essential HTN 04/15/2018   . Hyperlipidemia associated with type 2 diabetes mellitus ([place]) 04/15/2018  . Depression, major, recurrent, in partial remission ([place]) 04/15/2018  . Generalized anxiety disorder 04/15/2018  . Mild intermittent asthma, uncomplicated 83/33/8329    No Known Allergies  Past Surgical History:  Procedure Laterality Date  . ABDOMINAL HYSTERECTOMY    . OOPHORECTOMY     one ovary left    Social History   Tobacco Use  . Smoking status: Former Smoker    Quit date: 10/18/2005    Years since quitting: 13.5  . Smokeless tobacco: Never Used  Substance Use Topics  . Alcohol use: Never    Frequency: Never  . Drug use: Never     Medication list has been reviewed and updated.  Current Meds  Medication Sig  . albuterol (VENTOLIN HFA) 108 (90 Base) MCG/ACT inhaler INHALE 1 TO 2 PUFFS INTO THE LUNGS EVERY 6 HOURS AS NEEDED FOR WHEEZING OR SHORTNESS OF BREATH  . ALPRAZolam (XANAX) 0.25 MG tablet Take 0.25 mg by mouth at bedtime as needed for anxiety.  . blood glucose meter kit and supplies KIT Dispense based on patient and insurance preference. Use up to four times daily as directed. (FOR ICD-9 250.00, 250.01).  Marland Kitchen buPROPion (WELLBUTRIN XL) 300 MG 24 hr tablet TAKE 1 TABLET BY MOUTH EVERY DAY  . escitalopram (LEXAPRO) 20 MG tablet TAKE 1 TABLET BY MOUTH EVERY DAY  . fluticasone (FLOVENT HFA) 220 MCG/ACT inhaler Inhale 1-2 puffs into the lungs daily.  Marland Kitchen gabapentin (NEURONTIN) 100 MG capsule TAKE 1 TO 3 CAPSULES(100 TO 300 MG) BY MOUTH AT BEDTIME  . lisinopril (ZESTRIL) 10 MG tablet TAKE 1 TABLET BY MOUTH EVERY DAY  . metFORMIN (GLUCOPHAGE-XR) 500 MG 24 hr tablet TAKE 1 TABLET(500 MG) BY MOUTH DAILY  . pravastatin (PRAVACHOL) 40 MG tablet TAKE 1 TABLET BY MOUTH EVERY DAY  . triamcinolone cream (KENALOG) 0.1 % Apply 1 application topically 2 (two) times daily.    PHQ 2/9 Scores 05/01/2019 11/11/2018 04/24/2018 01/07/2018  PHQ - 2 Score 0 3 0 0  PHQ- 9 Score 3 8 0 3  Exception Documentation - - -  Medical reason    BP Readings from Last 3 Encounters:  05/01/19 118/78  11/11/18 109/76  04/24/18 104/80    Physical Exam Vitals signs and nursing note reviewed.  Constitutional:      General: She is not in acute distress.    Appearance: She is well-developed.  HENT:     Head: Normocephalic and atraumatic.     Right Ear: Tympanic membrane and ear canal normal.     Left Ear: Tympanic membrane and ear canal normal.     Nose:     Right Sinus: No maxillary sinus tenderness.     Left Sinus: No maxillary sinus tenderness.  Eyes:     General: No scleral icterus.       Right eye: No discharge.        Left eye: No discharge.     Conjunctiva/sclera: Conjunctivae normal.  Neck:     Musculoskeletal: Normal range of motion. No erythema.     Thyroid: No thyromegaly.     Vascular: No carotid bruit.  Cardiovascular:     Rate and Rhythm: Normal rate and regular rhythm.     Pulses: Normal pulses.     Heart sounds: Normal heart sounds.  Pulmonary:     Effort: Pulmonary effort is normal. No respiratory distress.  Breath sounds: No wheezing.  Chest:     Breasts:        Right: No mass, nipple discharge, skin change or tenderness.        Left: No mass, nipple discharge, skin change or tenderness.  Abdominal:     General: Bowel sounds are normal.     Palpations: Abdomen is soft.     Tenderness: There is no abdominal tenderness.  Musculoskeletal: Normal range of motion.     Right lower leg: No edema.     Left lower leg: No edema.  Lymphadenopathy:     Cervical: No cervical adenopathy.  Skin:    General: Skin is warm and dry.     Findings: No rash.  Neurological:     Mental Status: She is alert and oriented to person, place, and time.     Cranial Nerves: No cranial nerve deficit.     Sensory: No sensory deficit.     Deep Tendon Reflexes: Reflexes are normal and symmetric.  Psychiatric:        Speech: Speech normal.        Behavior: Behavior normal.        Thought Content:  Thought content normal.     Wt Readings from Last 3 Encounters:  05/01/19 209 lb (94.8 kg)  11/11/18 203 lb (92.1 kg)  04/24/18 203 lb (92.1 kg)    BP 118/78   Pulse 96   Ht 5' 5"  (1.651 m)   Wt 209 lb (94.8 kg)   SpO2 96%   BMI 34.78 kg/m   Assessment and Plan: 1. Annual physical exam Normal exam except for weight Work on diet, continue exercise - POCT urinalysis dipstick  2. Encounter for screening mammogram for breast cancer Scheduled in December at [facility]  3. Benign essential HTN Clinically stable exam with well controlled BP.   Tolerating medications, lisinopril 10 mg, without side effects at this time. Pt to continue current regimen and low sodium diet; benefits of regular exercise as able discussed. - CBC with Differential/Platelet - Comprehensive metabolic panel  4. Type II diabetes mellitus with complication (HCC) Clinically stable by exam and report without s/s of hypoglycemia. DM complicated by htn, lipids, weight. Tolerating medications metformin 500 mg daily well without side effects or other concerns. Pt has DM eye exam scheduled She is due for PPV-23 - Hemoglobin A1c  5. Hyperlipidemia associated with type 2 diabetes mellitus (HCC) Tolerating moderate intensity statin medication without side effects at this time LDL is not at goal of < 70 on current dose Continue same therapy without change at this time. Consider changing to a high intensity statin after labs return - Lipid panel  6. Depression, major, recurrent, in partial remission ([place]) Clinically stable and doing well on current therapy of lexapro and bupropion. 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 No SI/HI, no medication side effects - TSH  7. Mild intermittent asthma, uncomplicated Symptoms are controlled on daily ICS moderate dose Will continue same regimen; pt will follow up if worsening  8. Need for vaccination for pneumococcus  - Pneumococcal polysaccharide vaccine 23-valent greater than or equal to 2yo subcutaneous/IM   Partially dictated using Editor, commissioning. Any errors are unintentional.  Halina Maidens, MD [facility]  05/01/2019

## 2019-05-02 LAB — CBC WITH DIFFERENTIAL/PLATELET
Basophils Absolute: 0 10*3/uL (ref 0.0–0.2)
Basos: 0 %
EOS (ABSOLUTE): 0.3 10*3/uL (ref 0.0–0.4)
Eos: 5 %
Hematocrit: 44.5 % (ref 34.0–46.6)
Hemoglobin: 14.5 g/dL (ref 11.1–15.9)
Immature Grans (Abs): 0 10*3/uL (ref 0.0–0.1)
Immature Granulocytes: 0 %
Lymphocytes Absolute: 2 10*3/uL (ref 0.7–3.1)
Lymphs: 34 %
MCH: 29.1 pg (ref 26.6–33.0)
MCHC: 32.6 g/dL (ref 31.5–35.7)
MCV: 89 fL (ref 79–97)
Monocytes Absolute: 0.4 10*3/uL (ref 0.1–0.9)
Monocytes: 6 %
Neutrophils Absolute: 3.2 10*3/uL (ref 1.4–7.0)
Neutrophils: 55 %
Platelets: 304 10*3/uL (ref 150–450)
RBC: 4.99 x10E6/uL (ref 3.77–5.28)
RDW: 13.1 % (ref 11.7–15.4)
WBC: 5.9 10*3/uL (ref 3.4–10.8)

## 2019-05-02 LAB — COMPREHENSIVE METABOLIC PANEL
ALT: 24 IU/L (ref 0–32)
AST: 16 IU/L (ref 0–40)
Albumin/Globulin Ratio: 2 (ref 1.2–2.2)
Albumin: 4.6 g/dL (ref 3.8–4.9)
Alkaline Phosphatase: 85 IU/L (ref 39–117)
BUN/Creatinine Ratio: 21 (ref 9–23)
BUN: 14 mg/dL (ref 6–24)
Bilirubin Total: 0.4 mg/dL (ref 0.0–1.2)
CO2: 25 mmol/L (ref 20–29)
Calcium: 10.2 mg/dL (ref 8.7–10.2)
Chloride: 101 mmol/L (ref 96–106)
Creatinine, Ser: 0.67 mg/dL (ref 0.57–1.00)
GFR calc Af Amer: 115 mL/min/{1.73_m2} (ref >59)
GFR calc non Af Amer: 100 mL/min/{1.73_m2} (ref >59)
Globulin, Total: 2.3 g/dL (ref 1.5–4.5)
Glucose: 110 mg/dL — ABNORMAL HIGH (ref 65–99)
Potassium: 4.8 mmol/L (ref 3.5–5.2)
Sodium: 141 mmol/L (ref 134–144)
Total Protein: 6.9 g/dL (ref 6.0–8.5)

## 2019-05-02 LAB — HEMOGLOBIN A1C
Est. average glucose Bld gHb Est-mCnc: 134 mg/dL
Hgb A1c MFr Bld: 6.3 % — ABNORMAL HIGH (ref 4.8–5.6)

## 2019-05-02 LAB — LIPID PANEL
Chol/HDL Ratio: 4 ratio (ref 0.0–4.4)
Cholesterol, Total: 185 mg/dL (ref 100–199)
HDL: 46 mg/dL (ref >39)
LDL Chol Calc (NIH): 107 mg/dL — ABNORMAL HIGH (ref 0–99)
Triglycerides: 182 mg/dL — ABNORMAL HIGH (ref 0–149)
VLDL Cholesterol Cal: 32 mg/dL (ref 5–40)

## 2019-05-02 LAB — TSH: TSH: 0.876 u[IU]/mL (ref 0.450–4.500)

## 2019-05-13 LAB — HM DIABETES EYE EXAM

## 2019-05-18 ENCOUNTER — Other Ambulatory Visit: Payer: Self-pay | Admitting: Internal Medicine

## 2019-05-18 DIAGNOSIS — F411 Generalized anxiety disorder: Secondary | ICD-10-CM

## 2019-06-03 ENCOUNTER — Encounter: Payer: Self-pay | Admitting: Internal Medicine

## 2019-06-12 ENCOUNTER — Encounter: Payer: Self-pay | Admitting: Internal Medicine

## 2019-06-24 ENCOUNTER — Ambulatory Visit: Payer: Self-pay

## 2019-06-25 ENCOUNTER — Ambulatory Visit
Admission: RE | Admit: 2019-06-25 | Discharge: 2019-06-25 | Disposition: A | Discharge status: Home / Self Care | Payer: BC Managed Care – PPO | Source: Ambulatory Visit | Attending: Internal Medicine | Admitting: Internal Medicine

## 2019-06-25 ENCOUNTER — Other Ambulatory Visit: Payer: Self-pay

## 2019-06-25 DIAGNOSIS — Z1231 Encounter for screening mammogram for malignant neoplasm of breast: Secondary | ICD-10-CM | POA: Diagnosis not present

## 2019-07-09 ENCOUNTER — Encounter: Payer: Self-pay | Admitting: Internal Medicine

## 2019-07-13 ENCOUNTER — Other Ambulatory Visit: Payer: Self-pay | Admitting: Internal Medicine

## 2019-07-13 DIAGNOSIS — E1159 Type 2 diabetes mellitus with other circulatory complications: Secondary | ICD-10-CM

## 2019-07-23 ENCOUNTER — Other Ambulatory Visit: Payer: Self-pay | Admitting: Internal Medicine

## 2019-07-23 DIAGNOSIS — F324 Major depressive disorder, single episode, in partial remission: Secondary | ICD-10-CM

## 2019-07-23 DIAGNOSIS — E1169 Type 2 diabetes mellitus with other specified complication: Secondary | ICD-10-CM

## 2019-08-28 ENCOUNTER — Encounter: Payer: Self-pay | Admitting: Internal Medicine

## 2019-09-01 ENCOUNTER — Ambulatory Visit: Payer: BC Managed Care – PPO | Admitting: Internal Medicine

## 2019-09-12 ENCOUNTER — Other Ambulatory Visit: Payer: Self-pay | Admitting: Internal Medicine

## 2019-09-12 ENCOUNTER — Encounter: Payer: Self-pay | Admitting: Internal Medicine

## 2019-09-12 DIAGNOSIS — F411 Generalized anxiety disorder: Secondary | ICD-10-CM

## 2019-09-12 MED ORDER — ALPRAZOLAM 0.25 MG PO TABS: 0.2500 mg | ORAL_TABLET | Freq: Every evening | ORAL | 0 refills | Status: DC | PRN

## 2019-10-12 ENCOUNTER — Other Ambulatory Visit: Payer: Self-pay | Admitting: Internal Medicine

## 2019-10-12 DIAGNOSIS — I10 Essential (primary) hypertension: Secondary | ICD-10-CM

## 2019-10-24 ENCOUNTER — Encounter: Payer: Self-pay | Admitting: Internal Medicine

## 2019-12-09 ENCOUNTER — Other Ambulatory Visit: Payer: Self-pay | Admitting: Internal Medicine

## 2019-12-09 DIAGNOSIS — F411 Generalized anxiety disorder: Secondary | ICD-10-CM

## 2019-12-09 NOTE — Telephone Encounter (Signed)
Requested medication (s) are due for refill today: yes  Requested medication (s) are on the active medication list: yes  Last refill:  09/12/19  Future visit scheduled: yes in December  Notes to clinic:  Med not delegated to NT to RF   Requested Prescriptions  Pending Prescriptions Disp Refills   ALPRAZolam (XANAX) 0.25 MG tablet [Pharmacy Med Name: ALPRAZOLAM 0.25MG  TABLETS] 30 tablet     Sig: TAKE 1 TABLET(0.25 MG) BY MOUTH AT BEDTIME AS NEEDED FOR ANXIETY      Not Delegated - Psychiatry:  Anxiolytics/Hypnotics Failed - 12/09/2019  5:19 PM      Failed - This refill cannot be delegated      Failed - Urine Drug Screen completed in last 360 days.      Failed - Valid encounter within last 6 months    Recent Outpatient Visits           7 months ago Annual physical exam   [facility] Reubin Milan, MD   1 year ago Post-concussion syndrome   [facility] Reubin Milan, MD   1 year ago Benign essential HTN   [facility] Reubin Milan, MD   1 year ago Type 2 diabetes mellitus without complication, without long-term current use of insulin [facility])   [facility] Reubin Milan, MD       Future Appointments             In 6 months Judithann Graves Nyoka Cowden, MD [facility], [facility]

## 2019-12-10 ENCOUNTER — Encounter: Payer: Self-pay | Admitting: Internal Medicine

## 2019-12-10 ENCOUNTER — Other Ambulatory Visit: Payer: Self-pay | Admitting: Internal Medicine

## 2019-12-10 DIAGNOSIS — F411 Generalized anxiety disorder: Secondary | ICD-10-CM

## 2019-12-10 MED ORDER — ALPRAZOLAM 0.25 MG PO TABS: 0.2500 mg | ORAL_TABLET | Freq: Every evening | ORAL | 0 refills | Status: DC | PRN

## 2019-12-10 NOTE — Telephone Encounter (Signed)
Please call the pt and schedule appt. See previous msg from Dr Judithann Graves. CM

## 2019-12-10 NOTE — Telephone Encounter (Signed)
Please Advised pt last office visit was 05/01/2019.  KP

## 2019-12-10 NOTE — Telephone Encounter (Signed)
She was supposed to follow up in February.  Please call pt and get her scheduled for DM, HTN and med refill.

## 2019-12-10 NOTE — Telephone Encounter (Signed)
Left a message

## 2019-12-11 NOTE — Telephone Encounter (Addendum)
Pt needs to schedule appt.   Will route result note to [facility] Nurse Triage for follow up when patient returns call to clinic.    KP

## 2019-12-11 NOTE — Telephone Encounter (Signed)
Called pt to schedule appt left VM to call back.  KP

## 2019-12-11 NOTE — Telephone Encounter (Signed)
Called and LVM to call back about appointment.

## 2019-12-12 ENCOUNTER — Telehealth: Payer: Self-pay | Admitting: *Deleted

## 2019-12-12 NOTE — Telephone Encounter (Signed)
Phone call to pt.  Left vm to call office re: need to schedule an appt. For follow-up.

## 2019-12-12 NOTE — Telephone Encounter (Signed)
Per initial encounter, "She was supposed to follow up in February.  Please call pt and get her scheduled for DM, HTN and med refill."; attempted to contact pt to schedule appt; left message on voicemail; see telephone encounter dated 12/10/19.

## 2019-12-12 NOTE — Telephone Encounter (Signed)
Called LVM to set up appointment.

## 2019-12-12 NOTE — Telephone Encounter (Signed)
Pt has been sch for 12-30-2019 with dr Judithann Graves

## 2019-12-30 ENCOUNTER — Other Ambulatory Visit: Payer: Self-pay

## 2019-12-30 ENCOUNTER — Ambulatory Visit: Payer: BC Managed Care – PPO | Admitting: Internal Medicine

## 2019-12-30 ENCOUNTER — Encounter: Payer: Self-pay | Admitting: Internal Medicine

## 2019-12-30 VITALS — BP 124/86 | HR 94 | Ht 65.0 in | Wt 214.0 lb

## 2019-12-30 DIAGNOSIS — E118 Type 2 diabetes mellitus with unspecified complications: Secondary | ICD-10-CM

## 2019-12-30 DIAGNOSIS — I10 Essential (primary) hypertension: Secondary | ICD-10-CM

## 2019-12-30 DIAGNOSIS — Z1159 Encounter for screening for other viral diseases: Secondary | ICD-10-CM | POA: Diagnosis not present

## 2019-12-30 DIAGNOSIS — F3341 Major depressive disorder, recurrent, in partial remission: Secondary | ICD-10-CM | POA: Diagnosis not present

## 2019-12-30 NOTE — Progress Notes (Signed)
Date:  12/30/2019   Name:  Amanda Bautista   DOB:  1965/12/26   MRN:  [id number]   Chief Complaint: Diabetes (Follow up.) and Hypertension  Diabetes She presents for her follow-up diabetic visit. She has type 2 diabetes mellitus. Her disease course has been stable. Hypoglycemia symptoms include nervousness/anxiousness. Pertinent negatives for hypoglycemia include no headaches or tremors. Pertinent negatives for diabetes include no chest pain, no fatigue, no polydipsia and no polyuria. Current diabetic treatment includes oral agent (monotherapy). She is compliant with treatment all of the time. An ACE inhibitor/angiotensin II receptor blocker is being taken.  Hypertension This is a chronic problem. The problem is controlled. Pertinent negatives include no chest pain, headaches, palpitations or shortness of breath. Past treatments include ACE inhibitors. The current treatment provides significant improvement.  Depression        This is a chronic problem.The problem is unchanged.  Associated symptoms include no fatigue, no appetite change and no headaches.( Also anxiety)  Past treatments include SSRIs - Selective serotonin reuptake inhibitors and other medications. She is doing better since school is out.  Now also vaccinated for Covid.  She is planning to take the summer off.  She is starting to exercise more and working to lose the weight she gained last year.  Lab Results  Component Value Date   CREATININE 0.67 05/01/2019   BUN 14 05/01/2019   NA 141 05/01/2019   K 4.8 05/01/2019   CL 101 05/01/2019   CO2 25 05/01/2019   Lab Results  Component Value Date   CHOL 185 05/01/2019   HDL 46 05/01/2019   LDLCALC 107 (H) 05/01/2019   TRIG 182 (H) 05/01/2019   CHOLHDL 4.0 05/01/2019   Lab Results  Component Value Date   TSH 0.876 05/01/2019   Lab Results  Component Value Date   HGBA1C 6.3 (H) 05/01/2019   Lab Results  Component Value Date   WBC 5.9 05/01/2019   HGB 14.5 05/01/2019     HCT 44.5 05/01/2019   MCV 89 05/01/2019   PLT 304 05/01/2019   Lab Results  Component Value Date   ALT 24 05/01/2019   AST 16 05/01/2019   ALKPHOS 85 05/01/2019   BILITOT 0.4 05/01/2019     Review of Systems  Constitutional: Positive for unexpected weight change. Negative for appetite change, fatigue and fever.  HENT: Negative for tinnitus and trouble swallowing.   Eyes: Negative for visual disturbance.  Respiratory: Negative for cough, chest tightness and shortness of breath.   Cardiovascular: Negative for chest pain, palpitations and leg swelling.  Gastrointestinal: Negative for abdominal pain.  Endocrine: Negative for polydipsia and polyuria.  Genitourinary: Negative for dysuria and hematuria.  Musculoskeletal: Negative for arthralgias.  Neurological: Negative for tremors, numbness and headaches.  Psychiatric/Behavioral: Positive for depression. Negative for dysphoric mood and sleep disturbance. The patient is nervous/anxious.     Patient Active Problem List   Diagnosis Date Noted  . Xerosis of skin 11/11/2018  . Hypertensive retinopathy 05/06/2018  . Type II diabetes mellitus with complication ([place]) 69/45/0388  . Benign essential HTN 04/15/2018  . Hyperlipidemia associated with type 2 diabetes mellitus ([place]) 04/15/2018  . Depression, major, recurrent, in partial remission ([place]) 04/15/2018  . Generalized anxiety disorder 04/15/2018  . Mild intermittent asthma, uncomplicated 82/80/0349    No Known Allergies  Past Surgical History:  Procedure Laterality Date  . ABDOMINAL HYSTERECTOMY    . OOPHORECTOMY     one ovary left    Social History  Tobacco Use  . Smoking status: Former Smoker    Quit date: 10/18/2005    Years since quitting: 14.2  . Smokeless tobacco: Never Used  Vaping Use  . Vaping Use: Never used  Substance Use Topics  . Alcohol use: Never  . Drug use: Never     Medication list has been reviewed and updated.  Current Meds  Medication Sig   . albuterol (VENTOLIN HFA) 108 (90 Base) MCG/ACT inhaler INHALE 1 TO 2 PUFFS INTO THE LUNGS EVERY 6 HOURS AS NEEDED FOR WHEEZING OR SHORTNESS OF BREATH  . ALPRAZolam (XANAX) 0.25 MG tablet Take 1 tablet (0.25 mg total) by mouth at bedtime as needed for anxiety.  . blood glucose meter kit and supplies KIT Dispense based on patient and insurance preference. Use up to four times daily as directed. (FOR ICD-9 250.00, 250.01).  Marland Kitchen buPROPion (WELLBUTRIN XL) 300 MG 24 hr tablet TAKE 1 TABLET BY MOUTH EVERY DAY  . escitalopram (LEXAPRO) 20 MG tablet TAKE 1 TABLET BY MOUTH EVERY DAY  . fluticasone (FLOVENT HFA) 220 MCG/ACT inhaler Inhale 1-2 puffs into the lungs daily.  Marland Kitchen lisinopril (ZESTRIL) 10 MG tablet TAKE 1 TABLET BY MOUTH EVERY DAY  . metFORMIN (GLUCOPHAGE-XR) 500 MG 24 hr tablet TAKE 1 TABLET(500 MG) BY MOUTH DAILY  . pravastatin (PRAVACHOL) 40 MG tablet TAKE 1 TABLET BY MOUTH EVERY DAY  . triamcinolone cream (KENALOG) 0.1 % Apply 1 application topically 2 (two) times daily.    PHQ 2/9 Scores 12/30/2019 05/01/2019 11/11/2018 04/24/2018  PHQ - 2 Score 0 0 3 0  PHQ- 9 Score 2 3 8  0  Exception Documentation - - - -    GAD 7 : Generalized Anxiety Score 12/30/2019  Nervous, Anxious, on Edge 3  Control/stop worrying 3  Worry too much - different things 3  Trouble relaxing 2  Restless 2  Easily annoyed or irritable 2  Afraid - awful might happen 0  Total GAD 7 Score 15  Anxiety Difficulty Somewhat difficult    BP Readings from Last 3 Encounters:  12/30/19 124/86  05/01/19 118/78  11/11/18 109/76    Physical Exam Vitals and nursing note reviewed.  Constitutional:      General: She is not in acute distress.    Appearance: She is well-developed.  HENT:     Head: Normocephalic and atraumatic.     Right Ear: Tympanic membrane and ear canal normal.     Left Ear: Tympanic membrane and ear canal normal.     Nose:     Right Sinus: No maxillary sinus tenderness.     Left Sinus: No maxillary  sinus tenderness.  Eyes:     General: No scleral icterus.       Right eye: No discharge.        Left eye: No discharge.     Conjunctiva/sclera: Conjunctivae normal.  Neck:     Thyroid: No thyromegaly.     Vascular: No carotid bruit.  Cardiovascular:     Rate and Rhythm: Normal rate and regular rhythm.     Pulses: Normal pulses.     Heart sounds: Normal heart sounds.  Pulmonary:     Effort: Pulmonary effort is normal. No respiratory distress.     Breath sounds: No wheezing.  Chest:     Breasts:        Right: No mass, nipple discharge, skin change or tenderness.        Left: No mass, nipple discharge, skin change or tenderness.  Abdominal:     General: Bowel sounds are normal.     Palpations: Abdomen is soft.     Tenderness: There is no abdominal tenderness.  Musculoskeletal:        General: Normal range of motion.     Cervical back: Normal range of motion. No erythema.     Right lower leg: No edema.     Left lower leg: No edema.  Lymphadenopathy:     Cervical: No cervical adenopathy.  Skin:    General: Skin is warm and dry.     Capillary Refill: Capillary refill takes less than 2 seconds.     Findings: No rash.  Neurological:     Mental Status: She is alert and oriented to person, place, and time.     Cranial Nerves: No cranial nerve deficit.     Sensory: No sensory deficit.     Deep Tendon Reflexes: Reflexes are normal and symmetric.  Psychiatric:        Attention and Perception: Attention normal.        Mood and Affect: Mood normal.     Wt Readings from Last 3 Encounters:  12/30/19 214 lb (97.1 kg)  05/01/19 209 lb (94.8 kg)  11/11/18 203 lb (92.1 kg)    BP 124/86 (BP Location: Right Arm, Patient Position: Sitting, Cuff Size: Normal)   Pulse 94   Ht 5' 5"  (1.651 m)   Wt 214 lb (97.1 kg)   SpO2 96%   BMI 35.61 kg/m   Assessment and Plan: 1. Type II diabetes mellitus with complication (HCC) Clinically stable by exam and report without s/s of  hypoglycemia. DM complicated by HTN. Tolerating medications metformin only -  well without side effects or other concerns. - Hemoglobin A1c - Comprehensive metabolic panel  2. Benign essential HTN Clinically stable exam with well controlled BP. Tolerating medications without side effects at this time. Pt to continue current regimen and low sodium diet; benefits of regular exercise as able discussed.  3. Depression, major, recurrent, in partial remission ([place]) Clinically stable on current regimen with good control of symptoms, No SI or HI. Will continue current therapy.  Some anxiety but not using xanax very often - it is more of a safety net for her.  4. Need for hepatitis C screening test - Hepatitis C antibody   Partially dictated using Editor, commissioning. Any errors are unintentional.  Halina Maidens, MD [facility]  12/30/2019

## 2019-12-31 LAB — COMPREHENSIVE METABOLIC PANEL
ALT: 24 IU/L (ref 0–32)
AST: 18 IU/L (ref 0–40)
Albumin/Globulin Ratio: 1.8 (ref 1.2–2.2)
Albumin: 4.7 g/dL (ref 3.8–4.9)
Alkaline Phosphatase: 99 IU/L (ref 48–121)
BUN/Creatinine Ratio: 19 (ref 9–23)
BUN: 14 mg/dL (ref 6–24)
Bilirubin Total: 0.3 mg/dL (ref 0.0–1.2)
CO2: 27 mmol/L (ref 20–29)
Calcium: 10.3 mg/dL — ABNORMAL HIGH (ref 8.7–10.2)
Chloride: 98 mmol/L (ref 96–106)
Creatinine, Ser: 0.74 mg/dL (ref 0.57–1.00)
GFR calc Af Amer: 105 mL/min/{1.73_m2} (ref >59)
GFR calc non Af Amer: 91 mL/min/{1.73_m2} (ref >59)
Globulin, Total: 2.6 g/dL (ref 1.5–4.5)
Glucose: 101 mg/dL — ABNORMAL HIGH (ref 65–99)
Potassium: 4.8 mmol/L (ref 3.5–5.2)
Sodium: 140 mmol/L (ref 134–144)
Total Protein: 7.3 g/dL (ref 6.0–8.5)

## 2019-12-31 LAB — HEPATITIS C ANTIBODY: Hep C Virus Ab: <0.1 s/co ratio (ref 0.0–0.9)

## 2019-12-31 LAB — HEMOGLOBIN A1C
Est. average glucose Bld gHb Est-mCnc: 131 mg/dL
Hgb A1c MFr Bld: 6.2 % — ABNORMAL HIGH (ref 4.8–5.6)

## 2020-02-06 ENCOUNTER — Encounter: Payer: Self-pay | Admitting: Internal Medicine

## 2020-02-06 ENCOUNTER — Other Ambulatory Visit: Payer: Self-pay

## 2020-02-06 DIAGNOSIS — E1159 Type 2 diabetes mellitus with other circulatory complications: Secondary | ICD-10-CM

## 2020-02-06 MED ORDER — METFORMIN HCL ER 500 MG PO TB24: 500.0000 mg | ORAL_TABLET | Freq: Every day | ORAL | 0 refills | Status: DC

## 2020-02-13 ENCOUNTER — Encounter: Payer: Self-pay | Admitting: Internal Medicine

## 2020-02-13 NOTE — Telephone Encounter (Signed)
Please Advise.  KP

## 2020-03-06 ENCOUNTER — Other Ambulatory Visit: Payer: Self-pay | Admitting: Internal Medicine

## 2020-03-06 DIAGNOSIS — J4520 Mild intermittent asthma, uncomplicated: Secondary | ICD-10-CM

## 2020-03-06 NOTE — Telephone Encounter (Signed)
Requested Prescriptions  Pending Prescriptions Disp Refills   albuterol (VENTOLIN HFA) 108 (90 Base) MCG/ACT inhaler [Pharmacy Med Name: ALBUTEROL HFA INH (200 PUFFS)6.7GM] 6.7 g 5    Sig: INHALE 1 TO 2 PUFFS INTO THE LUNGS EVERY 6 HOURS AS NEEDED FOR WHEEZING OR SHORTNESS OF BREATH     Pulmonology:  Beta Agonists Failed - 03/06/2020 10:00 AM      Failed - One inhaler should last at least one month. If the patient is requesting refills earlier, contact the patient to check for uncontrolled symptoms.      Passed - Valid encounter within last 12 months    Recent Outpatient Visits          2 months ago Type II diabetes mellitus with complication [facility])   [facility] Reubin Milan, MD   10 months ago Annual physical exam   [facility] Reubin Milan, MD   1 year ago Post-concussion syndrome   [facility] Reubin Milan, MD   1 year ago Benign essential HTN   [facility] Reubin Milan, MD   2 years ago Type 2 diabetes mellitus without complication, without long-term current use of insulin [facility])   [facility] Reubin Milan, MD      Future Appointments            In 3 months Judithann Graves Nyoka Cowden, MD [facility], [facility]

## 2020-03-09 ENCOUNTER — Other Ambulatory Visit: Payer: Self-pay | Admitting: Internal Medicine

## 2020-03-09 DIAGNOSIS — F411 Generalized anxiety disorder: Secondary | ICD-10-CM

## 2020-03-10 ENCOUNTER — Encounter: Payer: Self-pay | Admitting: Internal Medicine

## 2020-03-12 ENCOUNTER — Other Ambulatory Visit: Payer: Self-pay

## 2020-03-12 ENCOUNTER — Ambulatory Visit
Admission: RE | Admit: 2020-03-12 | Discharge: 2020-03-12 | Discharge status: Absent without leave | Payer: BC Managed Care – PPO | Source: Ambulatory Visit | Attending: Internal Medicine | Admitting: Internal Medicine

## 2020-03-19 ENCOUNTER — Encounter: Payer: Self-pay | Admitting: Internal Medicine

## 2020-03-31 ENCOUNTER — Encounter: Payer: Self-pay | Admitting: Internal Medicine

## 2020-04-01 ENCOUNTER — Other Ambulatory Visit: Payer: Self-pay | Admitting: Internal Medicine

## 2020-04-01 DIAGNOSIS — F411 Generalized anxiety disorder: Secondary | ICD-10-CM

## 2020-04-01 MED ORDER — ALPRAZOLAM 0.25 MG PO TABS: 0.2500 mg | ORAL_TABLET | Freq: Every evening | ORAL | 0 refills | Status: DC | PRN

## 2020-04-09 ENCOUNTER — Ambulatory Visit: Payer: BC Managed Care – PPO | Admitting: Internal Medicine

## 2020-04-09 ENCOUNTER — Other Ambulatory Visit: Payer: Self-pay

## 2020-04-09 ENCOUNTER — Encounter: Payer: Self-pay | Admitting: Internal Medicine

## 2020-04-09 VITALS — BP 130/82 | HR 96 | Ht 65.0 in | Wt 214.0 lb

## 2020-04-09 DIAGNOSIS — E118 Type 2 diabetes mellitus with unspecified complications: Secondary | ICD-10-CM | POA: Diagnosis not present

## 2020-04-09 DIAGNOSIS — Z23 Encounter for immunization: Secondary | ICD-10-CM

## 2020-04-09 DIAGNOSIS — F411 Generalized anxiety disorder: Secondary | ICD-10-CM | POA: Diagnosis not present

## 2020-04-09 DIAGNOSIS — I10 Essential (primary) hypertension: Secondary | ICD-10-CM | POA: Diagnosis not present

## 2020-04-09 MED ORDER — METFORMIN HCL ER 500 MG PO TB24: 500.0000 mg | ORAL_TABLET | Freq: Every day | ORAL | 1 refills | Status: DC

## 2020-04-09 MED ORDER — ALPRAZOLAM 0.25 MG PO TABS: 0.2500 mg | ORAL_TABLET | Freq: Every evening | ORAL | 5 refills | Status: DC | PRN

## 2020-04-09 MED ORDER — BUSPIRONE HCL 10 MG PO TABS: 10.0000 mg | ORAL_TABLET | Freq: Two times a day (BID) | ORAL | 5 refills | Status: DC

## 2020-04-09 MED ORDER — BUPROPION HCL ER (XL) 300 MG PO TB24: 300.0000 mg | ORAL_TABLET | Freq: Every day | ORAL | 1 refills | Status: DC

## 2020-04-09 NOTE — Progress Notes (Signed)
Date:  04/09/2020   Name:  Amanda Bautista   DOB:  [birth year]   MRN:  [id number]   Chief Complaint: Diabetes (6 month follow up)  Diabetes She presents for her follow-up diabetic visit. She has type 2 diabetes mellitus. Her disease course has been stable. Hypoglycemia symptoms include nervousness/anxiousness. Pertinent negatives for hypoglycemia include no confusion, dizziness or headaches. Pertinent negatives for diabetes include no chest pain, no fatigue, no foot ulcerations and no visual change. Symptoms are stable. Current diabetic treatment includes oral agent (monotherapy) (metformin). Her weight is stable. There is no compliance with monitoring of blood glucose. An ACE inhibitor/angiotensin II receptor blocker is being taken.  Anxiety Presents for follow-up visit. Symptoms include depressed mood, irritability, nervous/anxious behavior and restlessness. Patient reports no chest pain, confusion, dizziness, palpitations (occasional rapid heart rate), shortness of breath or suicidal ideas. Symptoms occur most days. The severity of symptoms is causing significant distress. The quality of sleep is good.   Compliance with medications is 76-100% (on Lexapro and bupropion).  Hypertension This is a chronic problem. The problem is unchanged. The problem is controlled. Associated symptoms include anxiety. Pertinent negatives include no chest pain, headaches, palpitations (occasional rapid heart rate) or shortness of breath. Past treatments include ACE inhibitors. The current treatment provides significant improvement.    Lab Results  Component Value Date   CREATININE 0.74 12/30/2019   BUN 14 12/30/2019   NA 140 12/30/2019   K 4.8 12/30/2019   CL 98 12/30/2019   CO2 27 12/30/2019   Lab Results  Component Value Date   CHOL 185 05/01/2019   HDL 46 05/01/2019   LDLCALC 107 (H) 05/01/2019   TRIG 182 (H) 05/01/2019   CHOLHDL 4.0 05/01/2019   Lab Results  Component Value Date   TSH 0.876  05/01/2019   Lab Results  Component Value Date   HGBA1C 6.2 (H) 12/30/2019   Lab Results  Component Value Date   WBC 5.9 05/01/2019   HGB 14.5 05/01/2019   HCT 44.5 05/01/2019   MCV 89 05/01/2019   PLT 304 05/01/2019   Lab Results  Component Value Date   ALT 24 12/30/2019   AST 18 12/30/2019   ALKPHOS 99 12/30/2019   BILITOT 0.3 12/30/2019     Review of Systems  Constitutional: Positive for irritability. Negative for chills, fatigue and fever.  HENT: Negative for trouble swallowing.   Eyes: Negative for visual disturbance.  Respiratory: Negative for choking, chest tightness and shortness of breath.   Cardiovascular: Negative for chest pain, palpitations (occasional rapid heart rate) and leg swelling.  Gastrointestinal: Negative for abdominal pain, constipation and diarrhea.  Neurological: Negative for dizziness and headaches.  Psychiatric/Behavioral: Positive for dysphoric mood. Negative for agitation, behavioral problems, confusion, sleep disturbance and suicidal ideas. The patient is nervous/anxious.     Patient Active Problem List   Diagnosis Date Noted  . Xerosis of skin 11/11/2018  . Hypertensive retinopathy 05/06/2018  . Type II diabetes mellitus with complication ([place]) 28/00/3491  . Benign essential HTN 04/15/2018  . Hyperlipidemia associated with type 2 diabetes mellitus ([place]) 04/15/2018  . Depression, major, recurrent, in partial remission ([place]) 04/15/2018  . Generalized anxiety disorder 04/15/2018  . Mild intermittent asthma, uncomplicated 79/15/0569    No Known Allergies  Past Surgical History:  Procedure Laterality Date  . ABDOMINAL HYSTERECTOMY    . OOPHORECTOMY     one ovary left    Social History   Tobacco Use  . Smoking status: Former Smoker  Quit date: 10/18/2005    Years since quitting: 14.4  . Smokeless tobacco: Never Used  Vaping Use  . Vaping Use: Never used  Substance Use Topics  . Alcohol use: Never  . Drug use: Never      Medication list has been reviewed and updated.  Current Meds  Medication Sig  . albuterol (VENTOLIN HFA) 108 (90 Base) MCG/ACT inhaler INHALE 1 TO 2 PUFFS INTO THE LUNGS EVERY 6 HOURS AS NEEDED FOR WHEEZING OR SHORTNESS OF BREATH  . ALPRAZolam (XANAX) 0.25 MG tablet Take 1 tablet (0.25 mg total) by mouth at bedtime as needed for anxiety.  . blood glucose meter kit and supplies KIT Dispense based on patient and insurance preference. Use up to four times daily as directed. (FOR ICD-9 250.00, 250.01).  Marland Kitchen buPROPion (WELLBUTRIN XL) 300 MG 24 hr tablet TAKE 1 TABLET BY MOUTH EVERY DAY  . escitalopram (LEXAPRO) 20 MG tablet TAKE 1 TABLET BY MOUTH EVERY DAY  . fluticasone (FLOVENT HFA) 220 MCG/ACT inhaler Inhale 1-2 puffs into the lungs daily.  Marland Kitchen lisinopril (ZESTRIL) 10 MG tablet TAKE 1 TABLET BY MOUTH EVERY DAY  . metFORMIN (GLUCOPHAGE-XR) 500 MG 24 hr tablet Take 1 tablet (500 mg total) by mouth daily with breakfast.  . pravastatin (PRAVACHOL) 40 MG tablet TAKE 1 TABLET BY MOUTH EVERY DAY  . triamcinolone cream (KENALOG) 0.1 % Apply 1 application topically 2 (two) times daily.    PHQ 2/9 Scores 04/09/2020 12/30/2019 05/01/2019 11/11/2018  PHQ - 2 Score 2 0 0 3  PHQ- 9 Score 3 2 3 8   Exception Documentation - - - -    GAD 7 : Generalized Anxiety Score 04/09/2020 12/30/2019  Nervous, Anxious, on Edge 3 3  Control/stop worrying 3 3  Worry too much - different things 3 3  Trouble relaxing 1 2  Restless 1 2  Easily annoyed or irritable 0 2  Afraid - awful might happen 3 0  Total GAD 7 Score 14 15  Anxiety Difficulty Somewhat difficult Somewhat difficult    BP Readings from Last 3 Encounters:  04/09/20 130/82  12/30/19 124/86  05/01/19 118/78    Physical Exam Vitals and nursing note reviewed.  Constitutional:      General: She is not in acute distress.    Appearance: She is well-developed.  HENT:     Head: Normocephalic and atraumatic.  Cardiovascular:     Rate and Rhythm:  Normal rate and regular rhythm.     Pulses: Normal pulses.     Heart sounds: No murmur heard.   Pulmonary:     Effort: Pulmonary effort is normal. No respiratory distress.     Breath sounds: No wheezing or rhonchi.  Musculoskeletal:     Cervical back: Normal range of motion.     Right lower leg: No edema.     Left lower leg: No edema.  Lymphadenopathy:     Cervical: No cervical adenopathy.  Skin:    General: Skin is warm and dry.     Capillary Refill: Capillary refill takes less than 2 seconds.     Findings: No rash.  Neurological:     General: No focal deficit present.     Mental Status: She is alert and oriented to person, place, and time.  Psychiatric:        Mood and Affect: Mood is anxious.        Speech: Speech normal.        Judgment: Judgment normal.  Wt Readings from Last 3 Encounters:  04/09/20 214 lb (97.1 kg)  12/30/19 214 lb (97.1 kg)  05/01/19 209 lb (94.8 kg)    BP 130/82   Pulse 96   Ht 5' 5"  (1.651 m)   Wt 214 lb (97.1 kg)   SpO2 97%   BMI 35.61 kg/m   Assessment and Plan: 1. Generalized anxiety disorder Work situation causing significant distress but hopefully improving with additional help She is taking xanax currently almost every day during the week Will continue current regimen and add buspar 10 mg bid - ALPRAZolam (XANAX) 0.25 MG tablet; Take 1 tablet (0.25 mg total) by mouth at bedtime as needed for anxiety.  Dispense: 30 tablet; Refill: 5 - buPROPion (WELLBUTRIN XL) 300 MG 24 hr tablet; Take 1 tablet (300 mg total) by mouth daily.  Dispense: 90 tablet; Refill: 1  2. Need for immunization against influenza - Flu Vaccine QUAD 36+ mos IM  3. Benign essential HTN Clinically stable exam with well controlled BP. Tolerating medications without side effects at this time. Pt to continue current regimen and low sodium diet; benefits of regular exercise as able discussed.  4. Type II diabetes mellitus with complication (HCC) BS has been well  controlled; pt does not check FSBS often but tries to follow a healthy diet Will refill metformin  Check labs next visit @ [facility] - metFORMIN (GLUCOPHAGE-XR) 500 MG 24 hr tablet; Take 1 tablet (500 mg total) by mouth daily with breakfast.  Dispense: 90 tablet; Refill: 1    Partially dictated using Editor, commissioning. Any errors are unintentional.  Halina Maidens, MD [facility]  04/09/2020

## 2020-05-06 ENCOUNTER — Encounter: Payer: BC Managed Care – PPO | Admitting: Internal Medicine

## 2020-05-06 ENCOUNTER — Other Ambulatory Visit: Payer: Self-pay | Admitting: Internal Medicine

## 2020-05-06 DIAGNOSIS — J4520 Mild intermittent asthma, uncomplicated: Secondary | ICD-10-CM

## 2020-05-19 ENCOUNTER — Encounter: Payer: Self-pay | Admitting: Internal Medicine

## 2020-05-21 ENCOUNTER — Other Ambulatory Visit: Payer: Self-pay | Admitting: Internal Medicine

## 2020-05-21 DIAGNOSIS — F411 Generalized anxiety disorder: Secondary | ICD-10-CM

## 2020-05-21 MED ORDER — BUSPIRONE HCL 10 MG PO TABS: 10.0000 mg | ORAL_TABLET | Freq: Three times a day (TID) | ORAL | 5 refills | Status: DC

## 2020-06-22 ENCOUNTER — Encounter: Payer: BC Managed Care – PPO | Admitting: Internal Medicine

## 2020-06-27 ENCOUNTER — Encounter: Payer: Self-pay | Admitting: Internal Medicine

## 2020-06-27 DIAGNOSIS — Z90710 Acquired absence of both cervix and uterus: Secondary | ICD-10-CM | POA: Insufficient documentation

## 2020-06-28 ENCOUNTER — Encounter: Payer: BC Managed Care – PPO | Admitting: Internal Medicine

## 2020-06-28 ENCOUNTER — Encounter: Payer: Self-pay | Admitting: Internal Medicine

## 2020-06-28 NOTE — Progress Notes (Deleted)
Date:  06/28/2020   Name:  Amanda Bautista   DOB:  [birth year]   MRN:  [id number]   Chief Complaint: No chief complaint on file.  Amanda Bautista is a 54 y.o. female who presents today for her Complete Annual Exam. She feels {DESC; WELL/FAIRLY WELL/POORLY:18703}. She reports exercising ***. She reports she is sleeping {DESC; WELL/FAIRLY WELL/POORLY:18703}. Breast complaints ***.  Eye exam is not current.  Mammogram: 06/2019 DEXA: none Pap smear: discontinued Colonoscopy: 1/22013 repeat 2023  Immunization History  Administered Date(s) Administered  . Influenza, Seasonal, Injecte, Preservative Fre 03/24/2008  . Influenza,inj,Quad PF,6+ Mos 04/24/2018, 03/05/2019, 04/09/2020  . Influenza-Unspecified 04/04/2016  . MMR 02/01/2016  . Moderna Sars-Covid-2 Vaccination 08/27/2019, 09/26/2019  . PPD Test 01/20/2019  . Pneumococcal Conjugate-13 07/09/2015  . Pneumococcal Polysaccharide-23 05/01/2019  . Pneumococcal-Unspecified 07/09/2015  . Tdap 07/09/2015    Diabetes She presents for her follow-up diabetic visit. She has type 2 diabetes mellitus. Pertinent negatives for hypoglycemia include no dizziness, headaches, nervousness/anxiousness or tremors. Pertinent negatives for diabetes include no chest pain, no fatigue, no polydipsia and no polyuria. Current diabetic treatment includes oral agent (monotherapy). She is compliant with treatment all of the time. There is no compliance with monitoring of blood glucose. An ACE inhibitor/angiotensin II receptor blocker is being taken. Eye exam is not current.  Hypertension This is a chronic problem. The problem is controlled. Pertinent negatives include no chest pain, headaches, palpitations or shortness of breath. Past treatments include ACE inhibitors. The current treatment provides significant improvement. There are no compliance problems.   Hyperlipidemia This is a chronic problem. The problem is controlled. Pertinent negatives include no chest pain  or shortness of breath. Current antihyperlipidemic treatment includes statins. The current treatment provides moderate improvement of lipids.    Lab Results  Component Value Date   CREATININE 0.74 12/30/2019   BUN 14 12/30/2019   NA 140 12/30/2019   K 4.8 12/30/2019   CL 98 12/30/2019   CO2 27 12/30/2019   Lab Results  Component Value Date   CHOL 185 05/01/2019   HDL 46 05/01/2019   LDLCALC 107 (H) 05/01/2019   TRIG 182 (H) 05/01/2019   CHOLHDL 4.0 05/01/2019   Lab Results  Component Value Date   TSH 0.876 05/01/2019   Lab Results  Component Value Date   HGBA1C 6.2 (H) 12/30/2019   Lab Results  Component Value Date   WBC 5.9 05/01/2019   HGB 14.5 05/01/2019   HCT 44.5 05/01/2019   MCV 89 05/01/2019   PLT 304 05/01/2019   Lab Results  Component Value Date   ALT 24 12/30/2019   AST 18 12/30/2019   ALKPHOS 99 12/30/2019   BILITOT 0.3 12/30/2019     Review of Systems  Constitutional: Negative for chills, fatigue and fever.  HENT: Negative for congestion, hearing loss, tinnitus, trouble swallowing and voice change.   Eyes: Negative for visual disturbance.  Respiratory: Negative for cough, chest tightness, shortness of breath and wheezing.   Cardiovascular: Negative for chest pain, palpitations and leg swelling.  Gastrointestinal: Negative for abdominal pain, constipation, diarrhea and vomiting.  Endocrine: Negative for polydipsia and polyuria.  Genitourinary: Negative for dysuria, frequency, genital sores, vaginal bleeding and vaginal discharge.  Musculoskeletal: Negative for arthralgias, gait problem and joint swelling.  Skin: Negative for color change and rash.  Neurological: Negative for dizziness, tremors, light-headedness and headaches.  Hematological: Negative for adenopathy. Does not bruise/bleed easily.  Psychiatric/Behavioral: Negative for dysphoric mood and sleep disturbance. The patient is  not nervous/anxious.     Patient Active Problem List    Diagnosis Date Noted  . S/P TAH-BSO 06/27/2020  . Xerosis of skin 11/11/2018  . Hypertensive retinopathy 05/06/2018  . Type II diabetes mellitus with complication ([place]) 32/44/0102  . Benign essential HTN 04/15/2018  . Hyperlipidemia associated with type 2 diabetes mellitus ([place]) 04/15/2018  . Depression, major, recurrent, in partial remission ([place]) 04/15/2018  . Generalized anxiety disorder 04/15/2018  . Mild intermittent asthma, uncomplicated 72/53/6644    No Known Allergies  Past Surgical History:  Procedure Laterality Date  . ABDOMINAL HYSTERECTOMY    . OOPHORECTOMY     one ovary left    Social History   Tobacco Use  . Smoking status: Former Smoker    Quit date: 10/18/2005    Years since quitting: 14.7  . Smokeless tobacco: Never Used  Vaping Use  . Vaping Use: Never used  Substance Use Topics  . Alcohol use: Never  . Drug use: Never     Medication list has been reviewed and updated.  No outpatient medications have been marked as taking for the 06/28/20 encounter (Appointment) with Glean Hess, MD.    [facility] 2/9 Scores 04/09/2020 12/30/2019 05/01/2019 11/11/2018  PHQ - 2 Score 2 0 0 3  PHQ- 9 Score 3 2 3 8   Exception Documentation - - - -    GAD 7 : Generalized Anxiety Score 04/09/2020 12/30/2019  Nervous, Anxious, on Edge 3 3  Control/stop worrying 3 3  Worry too much - different things 3 3  Trouble relaxing 1 2  Restless 1 2  Easily annoyed or irritable 0 2  Afraid - awful might happen 3 0  Total GAD 7 Score 14 15  Anxiety Difficulty Somewhat difficult Somewhat difficult    BP Readings from Last 3 Encounters:  04/09/20 130/82  12/30/19 124/86  05/01/19 118/78    Physical Exam Vitals and nursing note reviewed.  Constitutional:      General: She is not in acute distress.    Appearance: She is well-developed.  HENT:     Head: Normocephalic and atraumatic.     Right Ear: Tympanic membrane and ear canal normal.     Left Ear: Tympanic membrane and  ear canal normal.     Nose:     Right Sinus: No maxillary sinus tenderness.     Left Sinus: No maxillary sinus tenderness.  Eyes:     General: No scleral icterus.       Right eye: No discharge.        Left eye: No discharge.     Conjunctiva/sclera: Conjunctivae normal.  Neck:     Thyroid: No thyromegaly.     Vascular: No carotid bruit.  Cardiovascular:     Rate and Rhythm: Normal rate and regular rhythm.     Pulses: Normal pulses.     Heart sounds: Normal heart sounds.  Pulmonary:     Effort: Pulmonary effort is normal. No respiratory distress.     Breath sounds: No wheezing.  Chest:  Breasts:     Right: No mass, nipple discharge, skin change or tenderness.     Left: No mass, nipple discharge, skin change or tenderness.    Abdominal:     General: Bowel sounds are normal.     Palpations: Abdomen is soft.     Tenderness: There is no abdominal tenderness.  Musculoskeletal:     Cervical back: Normal range of motion. No erythema.     Right lower  leg: No edema.     Left lower leg: No edema.  Lymphadenopathy:     Cervical: No cervical adenopathy.  Skin:    General: Skin is warm and dry.     Capillary Refill: Capillary refill takes less than 2 seconds.     Findings: No rash.  Neurological:     General: No focal deficit present.     Mental Status: She is alert and oriented to person, place, and time.     Cranial Nerves: No cranial nerve deficit.     Sensory: No sensory deficit.     Deep Tendon Reflexes: Reflexes are normal and symmetric.  Psychiatric:        Attention and Perception: Attention normal.        Mood and Affect: Mood normal.     Wt Readings from Last 3 Encounters:  04/09/20 214 lb (97.1 kg)  12/30/19 214 lb (97.1 kg)  05/01/19 209 lb (94.8 kg)    There were no vitals taken for this visit.  Assessment and Plan:

## 2020-07-12 ENCOUNTER — Encounter: Payer: Self-pay | Admitting: Internal Medicine

## 2020-07-30 ENCOUNTER — Other Ambulatory Visit: Payer: Self-pay | Admitting: Internal Medicine

## 2020-07-30 DIAGNOSIS — E1169 Type 2 diabetes mellitus with other specified complication: Secondary | ICD-10-CM

## 2020-07-30 DIAGNOSIS — F324 Major depressive disorder, single episode, in partial remission: Secondary | ICD-10-CM

## 2020-07-30 DIAGNOSIS — E785 Hyperlipidemia, unspecified: Secondary | ICD-10-CM

## 2020-09-20 ENCOUNTER — Other Ambulatory Visit: Payer: Self-pay | Admitting: Internal Medicine

## 2020-09-20 DIAGNOSIS — F411 Generalized anxiety disorder: Secondary | ICD-10-CM

## 2020-10-08 ENCOUNTER — Other Ambulatory Visit: Payer: Self-pay | Admitting: Internal Medicine

## 2020-10-08 DIAGNOSIS — F411 Generalized anxiety disorder: Secondary | ICD-10-CM

## 2020-10-08 NOTE — Telephone Encounter (Signed)
Requested medication (s) are due for refill today: Yes  Requested medication (s) are on the active medication list: Yes  Last refill:  04/09/20  Future visit scheduled: Yes  Notes to clinic:  See request.    Requested Prescriptions  Pending Prescriptions Disp Refills   ALPRAZolam (XANAX) 0.25 MG tablet [Pharmacy Med Name: ALPRAZOLAM 0.25MG  TABLETS] 30 tablet     Sig: TAKE 1 TABLET(0.25 MG) BY MOUTH AT BEDTIME AS NEEDED FOR ANXIETY      Not Delegated - Psychiatry:  Anxiolytics/Hypnotics Failed - 10/08/2020 12:18 PM      Failed - This refill cannot be delegated      Failed - Urine Drug Screen completed in last 360 days      Failed - Valid encounter within last 6 months    Recent Outpatient Visits           6 months ago Generalized anxiety disorder   [facility] Reubin Milan, MD   9 months ago Type II diabetes mellitus with complication [facility])   [facility] Reubin Milan, MD   1 year ago Annual physical exam   [facility] Reubin Milan, MD   1 year ago Post-concussion syndrome   [facility] Reubin Milan, MD   2 years ago Benign essential HTN   [facility] Reubin Milan, MD       Future Appointments             In 4 days Reubin Milan, MD [facility]

## 2020-10-11 LAB — HM DIABETES EYE EXAM

## 2020-10-12 ENCOUNTER — Ambulatory Visit (INDEPENDENT_AMBULATORY_CARE_PROVIDER_SITE_OTHER): Payer: BC Managed Care – PPO | Admitting: Internal Medicine

## 2020-10-12 ENCOUNTER — Encounter: Payer: Self-pay | Admitting: Internal Medicine

## 2020-10-12 ENCOUNTER — Other Ambulatory Visit: Payer: Self-pay

## 2020-10-12 VITALS — BP 118/84 | HR 87 | Temp 98.4°F | Ht 65.0 in | Wt 213.0 lb

## 2020-10-12 DIAGNOSIS — Z0000 Encounter for general adult medical examination without abnormal findings: Secondary | ICD-10-CM | POA: Diagnosis not present

## 2020-10-12 DIAGNOSIS — I10 Essential (primary) hypertension: Secondary | ICD-10-CM

## 2020-10-12 DIAGNOSIS — F3341 Major depressive disorder, recurrent, in partial remission: Secondary | ICD-10-CM

## 2020-10-12 DIAGNOSIS — E1169 Type 2 diabetes mellitus with other specified complication: Secondary | ICD-10-CM

## 2020-10-12 DIAGNOSIS — J4520 Mild intermittent asthma, uncomplicated: Secondary | ICD-10-CM

## 2020-10-12 DIAGNOSIS — E785 Hyperlipidemia, unspecified: Secondary | ICD-10-CM

## 2020-10-12 DIAGNOSIS — Z1231 Encounter for screening mammogram for malignant neoplasm of breast: Secondary | ICD-10-CM

## 2020-10-12 DIAGNOSIS — E118 Type 2 diabetes mellitus with unspecified complications: Secondary | ICD-10-CM

## 2020-10-12 LAB — POCT URINALYSIS DIPSTICK
Bilirubin, UA: NEGATIVE
Blood, UA: NEGATIVE
Glucose, UA: NEGATIVE
Ketones, UA: NEGATIVE
Leukocytes, UA: NEGATIVE
Nitrite, UA: NEGATIVE
Protein, UA: NEGATIVE
Spec Grav, UA: >=1.03 — AB (ref 1.010–1.025)
Urobilinogen, UA: 0.2 E.U./dL
pH, UA: 5 (ref 5.0–8.0)

## 2020-10-12 LAB — POCT UA - MICROALBUMIN: Microalbumin Ur, POC: 50 mg/L

## 2020-10-12 MED ORDER — BUPROPION HCL ER (XL) 300 MG PO TB24: 300.0000 mg | ORAL_TABLET | Freq: Every day | ORAL | 1 refills | Status: DC

## 2020-10-12 MED ORDER — BUSPIRONE HCL 10 MG PO TABS: 10.0000 mg | ORAL_TABLET | Freq: Two times a day (BID) | ORAL | 1 refills | Status: DC

## 2020-10-12 MED ORDER — ESCITALOPRAM OXALATE 20 MG PO TABS: 1.0000 | ORAL_TABLET | Freq: Every day | ORAL | 1 refills | Status: DC

## 2020-10-12 MED ORDER — METFORMIN HCL ER 500 MG PO TB24: 500.0000 mg | ORAL_TABLET | Freq: Every day | ORAL | 1 refills | Status: DC

## 2020-10-12 MED ORDER — PRAVASTATIN SODIUM 40 MG PO TABS: 40.0000 mg | ORAL_TABLET | Freq: Every day | ORAL | 1 refills | Status: DC

## 2020-10-12 MED ORDER — FLUTICASONE FUROATE-VILANTEROL 100-25 MCG/INH IN AEPB: 1.0000 | INHALATION_SPRAY | Freq: Every day | RESPIRATORY_TRACT | 11 refills | Status: DC

## 2020-10-12 MED ORDER — LISINOPRIL 10 MG PO TABS: 1.0000 | ORAL_TABLET | Freq: Every day | ORAL | 3 refills | Status: DC

## 2020-10-12 NOTE — Progress Notes (Signed)
Date:  10/12/2020   Name:  Amanda Bautista   DOB:  [birth year]   MRN:  [id number]   Chief Complaint: Annual Exam (Breat exam no pap)  Amanda Bautista is a 55 y.o. female who presents today for her Complete Annual Exam. She feels well. She reports exercising none. She reports she is sleeping well. Breast complaints none.  Mammogram: 06/2019 DEXA: none Pap smear: discontinued Colonoscopy: 06/2012  Immunization History  Administered Date(s) Administered  . Influenza, Seasonal, Injecte, Preservative Fre 03/24/2008  . Influenza,inj,Quad PF,6+ Mos 04/24/2018, 03/05/2019, 04/09/2020  . Influenza-Unspecified 04/04/2016  . MMR 02/01/2016  . Moderna Sars-Covid-2 Vaccination 08/27/2019, 09/26/2019, 04/22/2020  . PPD Test 01/20/2019  . Pneumococcal Conjugate-13 07/09/2015  . Pneumococcal Polysaccharide-23 05/01/2019  . Pneumococcal-Unspecified 07/09/2015  . Tdap 07/09/2015    Diabetes She presents for her follow-up diabetic visit. She has type 2 diabetes mellitus. Her disease course has been stable. Pertinent negatives for hypoglycemia include no dizziness, headaches, nervousness/anxiousness or tremors. Pertinent negatives for diabetes include no chest pain, no fatigue, no polydipsia and no polyuria. Current diabetic treatment includes oral agent (monotherapy) (metformin). An ACE inhibitor/angiotensin II receptor blocker is being taken. Eye exam is not current.  Hypertension This is a chronic problem. The problem is controlled. Pertinent negatives include no chest pain, headaches, palpitations or shortness of breath. Past treatments include ACE inhibitors. The current treatment provides significant improvement. There are no compliance problems.   Hyperlipidemia This is a chronic problem. The problem is controlled. Pertinent negatives include no chest pain or shortness of breath. Current antihyperlipidemic treatment includes statins. The current treatment provides moderate improvement of lipids.   Depression        This is a chronic problem.The problem is unchanged.  Associated symptoms include no fatigue and no headaches.  Past treatments include SSRIs - Selective serotonin reuptake inhibitors (plus bupropion and buspar).  Compliance with treatment is good. Asthma She complains of chest tightness, difficulty breathing and wheezing. There is no cough or shortness of breath. The problem occurs daily. Pertinent negatives include no chest pain, fever, headaches or trouble swallowing. Her past medical history is significant for asthma.    Lab Results  Component Value Date   CREATININE 0.74 12/30/2019   BUN 14 12/30/2019   NA 140 12/30/2019   K 4.8 12/30/2019   CL 98 12/30/2019   CO2 27 12/30/2019   Lab Results  Component Value Date   CHOL 185 05/01/2019   HDL 46 05/01/2019   LDLCALC 107 (H) 05/01/2019   TRIG 182 (H) 05/01/2019   CHOLHDL 4.0 05/01/2019   Lab Results  Component Value Date   TSH 0.876 05/01/2019   Lab Results  Component Value Date   HGBA1C 6.2 (H) 12/30/2019   Lab Results  Component Value Date   WBC 5.9 05/01/2019   HGB 14.5 05/01/2019   HCT 44.5 05/01/2019   MCV 89 05/01/2019   PLT 304 05/01/2019   Lab Results  Component Value Date   ALT 24 12/30/2019   AST 18 12/30/2019   ALKPHOS 99 12/30/2019   BILITOT 0.3 12/30/2019     Review of Systems  Constitutional: Negative for chills, fatigue, fever and unexpected weight change.  HENT: Negative for congestion, hearing loss, tinnitus, trouble swallowing and voice change.   Eyes: Negative for visual disturbance.  Respiratory: Positive for wheezing. Negative for cough, chest tightness and shortness of breath.   Cardiovascular: Negative for chest pain, palpitations and leg swelling.  Gastrointestinal: Negative for abdominal pain, constipation,  diarrhea and vomiting.  Endocrine: Negative for polydipsia and polyuria.  Genitourinary: Negative for dysuria, frequency, genital sores, vaginal bleeding and  vaginal discharge.  Musculoskeletal: Negative for arthralgias, gait problem and joint swelling.  Skin: Negative for color change and rash.  Allergic/Immunologic: Positive for environmental allergies.  Neurological: Negative for dizziness, tremors, light-headedness and headaches.  Hematological: Negative for adenopathy. Does not bruise/bleed easily.  Psychiatric/Behavioral: Positive for depression. Negative for dysphoric mood and sleep disturbance. The patient is not nervous/anxious.     Patient Active Problem List   Diagnosis Date Noted  . S/P TAH-BSO 06/27/2020  . Xerosis of skin 11/11/2018  . Hypertensive retinopathy 05/06/2018  . Type II diabetes mellitus with complication ([place]) 69/62/9528  . Benign essential HTN 04/15/2018  . Hyperlipidemia associated with type 2 diabetes mellitus ([place]) 04/15/2018  . Depression, major, recurrent, in partial remission ([place]) 04/15/2018  . Generalized anxiety disorder 04/15/2018  . Mild intermittent asthma, uncomplicated 41/32/4401    No Known Allergies  Past Surgical History:  Procedure Laterality Date  . ABDOMINAL HYSTERECTOMY    . OOPHORECTOMY     one ovary left    Social History   Tobacco Use  . Smoking status: Former Smoker    Quit date: 10/18/2005    Years since quitting: 14.9  . Smokeless tobacco: Never Used  Vaping Use  . Vaping Use: Never used  Substance Use Topics  . Alcohol use: Never  . Drug use: Never     Medication list has been reviewed and updated.  Current Meds  Medication Sig  . albuterol (VENTOLIN HFA) 108 (90 Base) MCG/ACT inhaler INHALE 1 TO 2 PUFFS INTO THE LUNGS EVERY 6 HOURS AS NEEDED FOR WHEEZING OR SHORTNESS OF BREATH  . ALPRAZolam (XANAX) 0.25 MG tablet TAKE 1 TABLET(0.25 MG) BY MOUTH AT BEDTIME AS NEEDED FOR ANXIETY  . buPROPion (WELLBUTRIN XL) 300 MG 24 hr tablet TAKE 1 TABLET(300 MG) BY MOUTH DAILY  . busPIRone (BUSPAR) 10 MG tablet Take 1 tablet (10 mg total) by mouth 3 (three) times daily. (Patient  taking differently: Take 10 mg by mouth 2 (two) times daily.)  . escitalopram (LEXAPRO) 20 MG tablet TAKE 1 TABLET BY MOUTH EVERY DAY  . FLOVENT HFA 220 MCG/ACT inhaler INHALE 1-2 PUFFS INTO THE LUNGS DAILY  . lisinopril (ZESTRIL) 10 MG tablet TAKE 1 TABLET BY MOUTH EVERY DAY  . metFORMIN (GLUCOPHAGE-XR) 500 MG 24 hr tablet Take 1 tablet (500 mg total) by mouth daily with breakfast.  . pravastatin (PRAVACHOL) 40 MG tablet TAKE 1 TABLET BY MOUTH EVERY DAY  . triamcinolone cream (KENALOG) 0.1 % Apply 1 application topically 2 (two) times daily.    PHQ 2/9 Scores 10/12/2020 04/09/2020 12/30/2019 05/01/2019  PHQ - 2 Score 2 2 0 0  PHQ- 9 Score 2 3 2 3   Exception Documentation - - - -    GAD 7 : Generalized Anxiety Score 10/12/2020 04/09/2020 12/30/2019  Nervous, Anxious, on Edge 1 3 3   Control/stop worrying 1 3 3   Worry too much - different things 1 3 3   Trouble relaxing 0 1 2  Restless 0 1 2  Easily annoyed or irritable 0 0 2  Afraid - awful might happen 0 3 0  Total GAD 7 Score 3 14 15   Anxiety Difficulty - Somewhat difficult Somewhat difficult    BP Readings from Last 3 Encounters:  10/12/20 118/84  04/09/20 130/82  12/30/19 124/86    Physical Exam Vitals and nursing note reviewed.  Constitutional:  General: She is not in acute distress.    Appearance: She is well-developed.  HENT:     Head: Normocephalic and atraumatic.     Right Ear: Tympanic membrane and ear canal normal.     Left Ear: Tympanic membrane and ear canal normal.     Nose:     Right Sinus: No maxillary sinus tenderness.     Left Sinus: No maxillary sinus tenderness.  Eyes:     General: No scleral icterus.       Right eye: No discharge.        Left eye: No discharge.     Conjunctiva/sclera: Conjunctivae normal.  Neck:     Thyroid: No thyromegaly.     Vascular: No carotid bruit.  Cardiovascular:     Rate and Rhythm: Normal rate and regular rhythm.     Pulses: Normal pulses.     Heart sounds: Normal  heart sounds.  Pulmonary:     Effort: Pulmonary effort is normal. No respiratory distress.     Breath sounds: No wheezing.  Chest:  Breasts:     Right: No mass, nipple discharge, skin change or tenderness.     Left: No mass, nipple discharge, skin change or tenderness.    Abdominal:     General: Bowel sounds are normal.     Palpations: Abdomen is soft.     Tenderness: There is no abdominal tenderness.  Musculoskeletal:        General: Normal range of motion.     Cervical back: Normal range of motion. No erythema.     Right lower leg: No edema.     Left lower leg: No edema.  Lymphadenopathy:     Cervical: No cervical adenopathy.  Skin:    General: Skin is warm and dry.     Capillary Refill: Capillary refill takes less than 2 seconds.     Findings: No rash.  Neurological:     General: No focal deficit present.     Mental Status: She is alert and oriented to person, place, and time.     Cranial Nerves: No cranial nerve deficit.     Sensory: No sensory deficit.     Deep Tendon Reflexes: Reflexes are normal and symmetric.  Psychiatric:        Attention and Perception: Attention normal.        Mood and Affect: Mood normal.        Behavior: Behavior normal.     Wt Readings from Last 3 Encounters:  10/12/20 213 lb (96.6 kg)  04/09/20 214 lb (97.1 kg)  12/30/19 214 lb (97.1 kg)    BP 118/84   Pulse 87   Temp 98.4 F (36.9 C) (Oral)   Ht 5' 5"  (1.651 m)   Wt 213 lb (96.6 kg)   SpO2 94%   BMI 35.45 kg/m   Assessment and Plan: 1. Annual physical exam Exam is normal except for weight. Encourage regular exercise and appropriate dietary changes.  2. Encounter for screening mammogram for breast cancer Schedule at [place]; Future  3. Benign essential HTN Clinically stable exam with well controlled BP. Tolerating medications without side effects at this time. Pt to continue current regimen and low sodium diet; benefits of regular exercise as  able discussed. - CBC with Differential/Platelet - POCT urinalysis dipstick - lisinopril (ZESTRIL) 10 MG tablet; Take 1 tablet (10 mg total) by mouth daily.  Dispense: 90 tablet; Refill: 3  4. Type II diabetes  mellitus with complication ([place]) Clinically stable by exam and report without s/s of hypoglycemia. DM complicated by HTN. Tolerating medications metformin well without side effects or other concerns. - Comprehensive metabolic panel - Hemoglobin A1c - POCT UA - Microalbumin - metFORMIN (GLUCOPHAGE-XR) 500 MG 24 hr tablet; Take 1 tablet (500 mg total) by mouth daily with breakfast.  Dispense: 90 tablet; Refill: 1  5. Hyperlipidemia associated with type 2 diabetes mellitus ([place]) On statin therapy - if not at goal, will need to consider high intensity statin - Lipid panel - pravastatin (PRAVACHOL) 40 MG tablet; Take 1 tablet (40 mg total) by mouth daily.  Dispense: 90 tablet; Refill: 1  6. Depression, major, recurrent, in partial remission ([place]) Clinically stable on current regimen with good control of symptoms, No SI or HI. Will continue current therapy. - TSH - escitalopram (LEXAPRO) 20 MG tablet; Take 1 tablet (20 mg total) by mouth daily.  Dispense: 90 tablet; Refill: 1 - busPIRone (BUSPAR) 10 MG tablet; Take 1 tablet (10 mg total) by mouth 2 (two) times daily.  Dispense: 180 tablet; Refill: 1 - buPROPion (WELLBUTRIN XL) 300 MG 24 hr tablet; Take 1 tablet (300 mg total) by mouth daily.  Dispense: 90 tablet; Refill: 1  7. Mild intermittent asthma, uncomplicated Increasing sx requiring use of albuterol daily Will hold flovent and begin Breo daily - fluticasone furoate-vilanterol (BREO ELLIPTA) 100-25 MCG/INH AEPB; Inhale 1 puff into the lungs daily.  Dispense: 1 each; Refill: 11   Partially dictated using Editor, commissioning. Any errors are unintentional.  Halina Maidens, MD [facility]  10/12/2020

## 2020-10-12 NOTE — Patient Instructions (Signed)
Hold flovent.  Start Breo - one puff daily.  You can continue to use albuterol but hopefully will not need it as often.

## 2020-10-13 ENCOUNTER — Telehealth: Payer: Self-pay

## 2020-10-13 LAB — COMPREHENSIVE METABOLIC PANEL
ALT: 34 IU/L — ABNORMAL HIGH (ref 0–32)
AST: 19 IU/L (ref 0–40)
Albumin/Globulin Ratio: 1.8 (ref 1.2–2.2)
Albumin: 4.6 g/dL (ref 3.8–4.9)
Alkaline Phosphatase: 95 IU/L (ref 44–121)
BUN/Creatinine Ratio: 18 (ref 9–23)
BUN: 12 mg/dL (ref 6–24)
Bilirubin Total: 0.4 mg/dL (ref 0.0–1.2)
CO2: 23 mmol/L (ref 20–29)
Calcium: 9.8 mg/dL (ref 8.7–10.2)
Chloride: 100 mmol/L (ref 96–106)
Creatinine, Ser: 0.68 mg/dL (ref 0.57–1.00)
Globulin, Total: 2.5 g/dL (ref 1.5–4.5)
Glucose: 108 mg/dL — ABNORMAL HIGH (ref 65–99)
Potassium: 4.5 mmol/L (ref 3.5–5.2)
Sodium: 141 mmol/L (ref 134–144)
Total Protein: 7.1 g/dL (ref 6.0–8.5)
eGFR: 102 mL/min/{1.73_m2} (ref >59)

## 2020-10-13 LAB — LIPID PANEL
Chol/HDL Ratio: 3.9 ratio (ref 0.0–4.4)
Cholesterol, Total: 185 mg/dL (ref 100–199)
HDL: 48 mg/dL (ref >39)
LDL Chol Calc (NIH): 106 mg/dL — ABNORMAL HIGH (ref 0–99)
Triglycerides: 177 mg/dL — ABNORMAL HIGH (ref 0–149)
VLDL Cholesterol Cal: 31 mg/dL (ref 5–40)

## 2020-10-13 LAB — CBC WITH DIFFERENTIAL/PLATELET
Basophils Absolute: 0 10*3/uL (ref 0.0–0.2)
Basos: 0 %
EOS (ABSOLUTE): 0.4 10*3/uL (ref 0.0–0.4)
Eos: 6 %
Hematocrit: 44.6 % (ref 34.0–46.6)
Hemoglobin: 14.4 g/dL (ref 11.1–15.9)
Immature Grans (Abs): 0 10*3/uL (ref 0.0–0.1)
Immature Granulocytes: 0 %
Lymphocytes Absolute: 2.1 10*3/uL (ref 0.7–3.1)
Lymphs: 32 %
MCH: 29.4 pg (ref 26.6–33.0)
MCHC: 32.3 g/dL (ref 31.5–35.7)
MCV: 91 fL (ref 79–97)
Monocytes Absolute: 0.5 10*3/uL (ref 0.1–0.9)
Monocytes: 7 %
Neutrophils Absolute: 3.6 10*3/uL (ref 1.4–7.0)
Neutrophils: 55 %
Platelets: 308 10*3/uL (ref 150–450)
RBC: 4.9 x10E6/uL (ref 3.77–5.28)
RDW: 13.3 % (ref 11.7–15.4)
WBC: 6.6 10*3/uL (ref 3.4–10.8)

## 2020-10-13 LAB — TSH: TSH: 1.55 u[IU]/mL (ref 0.450–4.500)

## 2020-10-13 LAB — HEMOGLOBIN A1C
Est. average glucose Bld gHb Est-mCnc: 134 mg/dL
Hgb A1c MFr Bld: 6.3 % — ABNORMAL HIGH (ref 4.8–5.6)

## 2020-10-13 NOTE — Telephone Encounter (Unsigned)
Copied from CRM [id number]. Topic: Quick Communication - Lab Results (Clinic Use ONLY) >> Oct 13, 2020 11:39 AM Leafy Ro wrote: Pt did view her results on mychart and would like a nurse to call her back concerning some of labs was elevated

## 2020-10-13 NOTE — Telephone Encounter (Signed)
Called and left VM to call back. Gave detailed labs on VM. Told patient she needs to be on a stronger statin medication and asked if she would be willing to try one.

## 2020-10-14 ENCOUNTER — Ambulatory Visit
Admission: RE | Admit: 2020-10-14 | Discharge: 2020-10-14 | Disposition: A | Discharge status: Home / Self Care | Payer: BC Managed Care – PPO | Source: Ambulatory Visit | Attending: Internal Medicine | Admitting: Internal Medicine

## 2020-10-14 ENCOUNTER — Other Ambulatory Visit: Payer: Self-pay

## 2020-10-14 DIAGNOSIS — Z1231 Encounter for screening mammogram for malignant neoplasm of breast: Secondary | ICD-10-CM | POA: Diagnosis not present

## 2020-10-15 ENCOUNTER — Encounter: Payer: Self-pay | Admitting: Internal Medicine

## 2020-10-18 ENCOUNTER — Other Ambulatory Visit: Payer: Self-pay | Admitting: Internal Medicine

## 2020-10-18 ENCOUNTER — Ambulatory Visit: Payer: Self-pay

## 2020-10-18 ENCOUNTER — Telehealth: Payer: Self-pay

## 2020-10-18 DIAGNOSIS — E1169 Type 2 diabetes mellitus with other specified complication: Secondary | ICD-10-CM

## 2020-10-18 MED ORDER — ROSUVASTATIN CALCIUM 10 MG PO TABS: 10.0000 mg | ORAL_TABLET | Freq: Every day | ORAL | 1 refills | Status: DC

## 2020-10-18 NOTE — Telephone Encounter (Signed)
I sent in Crestor.  Stop pravachol.  Will want to recheck labs in 4-6 months.

## 2020-10-18 NOTE — Telephone Encounter (Signed)
Copied from CRM [id number]. Topic: General - Inquiry >> Oct 15, 2020 11:47 AM Daphine Deutscher D wrote: Reason for CRM: Pt called saying she got a call from the office regarding her statin medication and she wants to let Dr. Judithann Graves know it is ok to change her rx to a higher dose.  CB#  [phone]

## 2020-10-18 NOTE — Telephone Encounter (Signed)
Medication was sent in.  KP

## 2020-10-18 NOTE — Telephone Encounter (Signed)
Called pt left VM to call back. Please read pt message from Dr. Judithann Graves.  Will route this note to [facility] Nurse just in case the patient returns call to clinic. [facility] Nurse may give patient results if pt returns the call.  CRM has also been created for this msg for call center purposes. Please attach any notes regarding this lab to this result message and do not create a new CRM.   KP

## 2020-10-18 NOTE — Telephone Encounter (Signed)
Please review. Labs statin pt needed to take stronger statin medication.  KP

## 2020-10-18 NOTE — Telephone Encounter (Signed)
Pt returned call. She is willing to change statin. Please send rx to [facility]

## 2020-10-18 NOTE — Telephone Encounter (Signed)
Notified in Triage Nurse encounter.

## 2020-10-18 NOTE — Telephone Encounter (Signed)
Called pt left VM stating med was sent in.  KP

## 2020-11-04 ENCOUNTER — Other Ambulatory Visit: Payer: Self-pay | Admitting: Internal Medicine

## 2020-11-04 DIAGNOSIS — F411 Generalized anxiety disorder: Secondary | ICD-10-CM

## 2020-11-04 NOTE — Telephone Encounter (Signed)
Requested medication (s) are due for refill today: yes  Requested medication (s) are on the active medication list: yes  Last refill: 10/08/2020  Future visit scheduled: yes  Notes to clinic:  this refill cannot be delegated    Requested Prescriptions  Pending Prescriptions Disp Refills   ALPRAZolam (XANAX) 0.25 MG tablet [Pharmacy Med Name: ALPRAZOLAM 0.25MG  TABLETS] 30 tablet     Sig: TAKE 1 TABLET(0.25 MG) BY MOUTH AT BEDTIME AS NEEDED FOR ANXIETY      Not Delegated - Psychiatry:  Anxiolytics/Hypnotics Failed - 11/04/2020 10:16 AM      Failed - This refill cannot be delegated      Failed - Urine Drug Screen completed in last 360 days      Passed - Valid encounter within last 6 months    Recent Outpatient Visits           3 weeks ago Annual physical exam   [facility] Reubin Milan, MD   6 months ago Generalized anxiety disorder   [facility] Reubin Milan, MD   10 months ago Type II diabetes mellitus with complication [facility])   [facility] Reubin Milan, MD   1 year ago Annual physical exam   [facility] Reubin Milan, MD   1 year ago Post-concussion syndrome   [facility] Reubin Milan, MD       Future Appointments             In 3 months Judithann Graves Nyoka Cowden, MD [facility], [facility]   In 11 months Judithann Graves Nyoka Cowden, MD [facility]

## 2020-11-04 NOTE — Telephone Encounter (Signed)
Please review. Last office visit 10/12/2020.  KP

## 2020-11-16 ENCOUNTER — Encounter: Payer: Self-pay | Admitting: Internal Medicine

## 2020-11-16 ENCOUNTER — Other Ambulatory Visit: Payer: Self-pay

## 2020-11-16 ENCOUNTER — Other Ambulatory Visit: Payer: Self-pay | Admitting: Internal Medicine

## 2020-11-16 ENCOUNTER — Ambulatory Visit: Payer: BC Managed Care – PPO | Admitting: Internal Medicine

## 2020-11-16 VITALS — BP 124/98 | HR 93 | Temp 98.9°F | Ht 65.0 in | Wt 209.0 lb

## 2020-11-16 DIAGNOSIS — N3000 Acute cystitis without hematuria: Secondary | ICD-10-CM | POA: Diagnosis not present

## 2020-11-16 DIAGNOSIS — N760 Acute vaginitis: Secondary | ICD-10-CM

## 2020-11-16 LAB — POC URINALYSIS WITH MICROSCOPIC (NON AUTO)MANUAL RESULT
Bilirubin, UA: NEGATIVE
Crystals: 0
Glucose, UA: NEGATIVE
Ketones, UA: 80
Mucus, UA: 0
Nitrite, UA: NEGATIVE
Protein, UA: POSITIVE — AB
RBC: 5 M/uL (ref 4.04–5.48)
Spec Grav, UA: 1.015 (ref 1.010–1.025)
Urobilinogen, UA: 0.2 E.U./dL
WBC Casts, UA: 50
pH, UA: 5 (ref 5.0–8.0)

## 2020-11-16 LAB — POCT WET PREP WITH KOH
KOH Prep POC: NEGATIVE
RBC Wet Prep HPF POC: 2
Trichomonas, UA: NEGATIVE
Yeast Wet Prep HPF POC: NEGATIVE

## 2020-11-16 MED ORDER — NITROFURANTOIN MONOHYD MACRO 100 MG PO CAPS: 100.0000 mg | ORAL_CAPSULE | Freq: Two times a day (BID) | ORAL | 0 refills | Status: AC

## 2020-11-16 NOTE — Progress Notes (Signed)
Date:  11/16/2020   Name:  Amanda Bautista   DOB:  02-07-1966   MRN:  [id number]   Chief Complaint: Vaginitis (X2 days, urgency, barely makes it to the bathroom, pt cant hold urine has urinated on herself)  Urinary Tract Infection  This is a new problem. The current episode started yesterday. The problem occurs every urination. The quality of the pain is described as burning. The pain is mild. There has been no fever. Associated symptoms include frequency and urgency. Pertinent negatives include no chills or flank pain. She has tried nothing for the symptoms.  Vaginal Itching The patient's primary symptoms include genital itching. The patient's pertinent negatives include no genital lesions or vaginal discharge. This is a new problem. Associated symptoms include frequency and urgency. Pertinent negatives include no chills, fever or flank pain.    Lab Results  Component Value Date   CREATININE 0.68 10/12/2020   BUN 12 10/12/2020   NA 141 10/12/2020   K 4.5 10/12/2020   CL 100 10/12/2020   CO2 23 10/12/2020   Lab Results  Component Value Date   CHOL 185 10/12/2020   HDL 48 10/12/2020   LDLCALC 106 (H) 10/12/2020   TRIG 177 (H) 10/12/2020   CHOLHDL 3.9 10/12/2020   Lab Results  Component Value Date   TSH 1.550 10/12/2020   Lab Results  Component Value Date   HGBA1C 6.3 (H) 10/12/2020   Lab Results  Component Value Date   WBC 6.6 10/12/2020   HGB 14.4 10/12/2020   HCT 44.6 10/12/2020   MCV 91 10/12/2020   PLT 308 10/12/2020   Lab Results  Component Value Date   ALT 34 (H) 10/12/2020   AST 19 10/12/2020   ALKPHOS 95 10/12/2020   BILITOT 0.4 10/12/2020     Review of Systems  Constitutional: Negative for chills, fatigue and fever.  Respiratory: Negative for shortness of breath.   Genitourinary: Positive for frequency and urgency. Negative for flank pain, vaginal discharge and vaginal pain.  Psychiatric/Behavioral: The patient is nervous/anxious.     Patient  Active Problem List   Diagnosis Date Noted  . S/P TAH-BSO 06/27/2020  . Xerosis of skin 11/11/2018  . Hypertensive retinopathy 05/06/2018  . Type II diabetes mellitus with complication (HCC) 04/15/2018  . Benign essential HTN 04/15/2018  . Hyperlipidemia associated with type 2 diabetes mellitus (HCC) 04/15/2018  . Depression, major, recurrent, in partial remission (HCC) 04/15/2018  . Generalized anxiety disorder 04/15/2018  . Mild intermittent asthma, uncomplicated 04/15/2018    No Known Allergies  Past Surgical History:  Procedure Laterality Date  . ABDOMINAL HYSTERECTOMY    . OOPHORECTOMY     one ovary left    Social History   Tobacco Use  . Smoking status: Former Smoker    Quit date: 10/18/2005    Years since quitting: 15.0  . Smokeless tobacco: Never Used  Vaping Use  . Vaping Use: Never used  Substance Use Topics  . Alcohol use: Never  . Drug use: Never     Medication list has been reviewed and updated.  Current Meds  Medication Sig  . albuterol (VENTOLIN HFA) 108 (90 Base) MCG/ACT inhaler INHALE 1 TO 2 PUFFS INTO THE LUNGS EVERY 6 HOURS AS NEEDED FOR WHEEZING OR SHORTNESS OF BREATH  . ALPRAZolam (XANAX) 0.25 MG tablet TAKE 1 TABLET(0.25 MG) BY MOUTH AT BEDTIME AS NEEDED FOR ANXIETY  . buPROPion (WELLBUTRIN XL) 300 MG 24 hr tablet Take 1 tablet (300 mg total) by  mouth daily.  . busPIRone (BUSPAR) 10 MG tablet Take 1 tablet (10 mg total) by mouth 2 (two) times daily.  Marland Kitchen escitalopram (LEXAPRO) 20 MG tablet Take 1 tablet (20 mg total) by mouth daily.  . fluticasone furoate-vilanterol (BREO ELLIPTA) 100-25 MCG/INH AEPB Inhale 1 puff into the lungs daily.  Marland Kitchen lisinopril (ZESTRIL) 10 MG tablet Take 1 tablet (10 mg total) by mouth daily.  . metFORMIN (GLUCOPHAGE-XR) 500 MG 24 hr tablet Take 1 tablet (500 mg total) by mouth daily with breakfast.  . rosuvastatin (CRESTOR) 10 MG tablet Take 1 tablet (10 mg total) by mouth daily.  Marland Kitchen triamcinolone cream (KENALOG) 0.1 % Apply  1 application topically 2 (two) times daily.    PHQ 2/9 Scores 11/16/2020 10/12/2020 04/09/2020 12/30/2019  PHQ - 2 Score 0 2 2 0  PHQ- 9 Score 0 2 3 2   Exception Documentation - - - -    GAD 7 : Generalized Anxiety Score 11/16/2020 10/12/2020 04/09/2020 12/30/2019  Nervous, Anxious, on Edge 0 1 3 3   Control/stop worrying 0 1 3 3   Worry too much - different things 0 1 3 3   Trouble relaxing 0 0 1 2  Restless 0 0 1 2  Easily annoyed or irritable 0 0 0 2  Afraid - awful might happen 0 0 3 0  Total GAD 7 Score 0 3 14 15   Anxiety Difficulty - - Somewhat difficult Somewhat difficult    BP Readings from Last 3 Encounters:  11/16/20 (!) 124/98  10/12/20 118/84  04/09/20 130/82    Physical Exam Vitals and nursing note reviewed.  Constitutional:      Appearance: She is well-developed.  Cardiovascular:     Rate and Rhythm: Normal rate and regular rhythm.     Heart sounds: Normal heart sounds.  Pulmonary:     Effort: Pulmonary effort is normal. No respiratory distress.     Breath sounds: Normal breath sounds.  Abdominal:     General: Bowel sounds are normal.     Palpations: Abdomen is soft.     Tenderness: There is no abdominal tenderness. There is no right CVA tenderness, left CVA tenderness, guarding or rebound.     Wt Readings from Last 3 Encounters:  11/16/20 209 lb (94.8 kg)  10/12/20 213 lb (96.6 kg)  04/09/20 214 lb (97.1 kg)    BP (!) 124/98   Pulse 93   Temp 98.9 F (37.2 C) (Oral)   Ht 5\' 5"  (1.651 m)   Wt 209 lb (94.8 kg)   SpO2 97%   BMI 34.78 kg/m   Assessment and Plan: 1. Acute cystitis without hematuria Continue fluids; follow up if no improvement - nitrofurantoin, macrocrystal-monohydrate, (MACROBID) 100 MG capsule; Take 1 capsule (100 mg total) by mouth 2 (two) times daily for 7 days.  Dispense: 14 capsule; Refill: 0 - POC urinalysis w microscopic (non auto) - Urine Culture  2. Acute vaginitis Minimal sx at present could be due to the UTI Will not  treat at this time - call after antibiotics for UTI are complete if sx worsen - POCT Wet Prep with KOH   Partially dictated using 12/12/20. Any errors are unintentional.  06/09/20, MD [facility]  11/16/2020

## 2020-11-18 ENCOUNTER — Other Ambulatory Visit: Payer: Self-pay | Admitting: Internal Medicine

## 2020-11-18 ENCOUNTER — Encounter: Payer: Self-pay | Admitting: Internal Medicine

## 2020-11-18 DIAGNOSIS — N760 Acute vaginitis: Secondary | ICD-10-CM

## 2020-11-18 MED ORDER — METRONIDAZOLE 500 MG PO TABS: 500.0000 mg | ORAL_TABLET | Freq: Two times a day (BID) | ORAL | 0 refills | Status: AC

## 2020-11-20 LAB — URINE CULTURE

## 2021-02-15 ENCOUNTER — Encounter: Payer: Self-pay | Admitting: Internal Medicine

## 2021-02-17 ENCOUNTER — Ambulatory Visit: Payer: BC Managed Care – PPO | Admitting: Internal Medicine

## 2021-02-22 DIAGNOSIS — S63642A Sprain of metacarpophalangeal joint of left thumb, initial encounter: Secondary | ICD-10-CM | POA: Insufficient documentation

## 2021-02-24 ENCOUNTER — Encounter: Payer: Self-pay | Admitting: Internal Medicine

## 2021-03-02 ENCOUNTER — Other Ambulatory Visit: Payer: Self-pay | Admitting: Internal Medicine

## 2021-03-02 DIAGNOSIS — F411 Generalized anxiety disorder: Secondary | ICD-10-CM

## 2021-03-02 NOTE — Telephone Encounter (Signed)
Requested medications are due for refill today.  yes  Requested medications are on the active medications list.  yes  Last refill. 11/04/2020  Future visit scheduled.   yes  Notes to clinic.  Medication not delegated. 

## 2021-03-02 NOTE — Telephone Encounter (Signed)
Please review.Last office visit 11/16/2020.  KP

## 2021-03-06 ENCOUNTER — Other Ambulatory Visit: Payer: Self-pay | Admitting: Internal Medicine

## 2021-03-06 DIAGNOSIS — F3341 Major depressive disorder, recurrent, in partial remission: Secondary | ICD-10-CM

## 2021-03-06 NOTE — Telephone Encounter (Signed)
filled 10/12/20 #90 1 RF

## 2021-03-18 ENCOUNTER — Ambulatory Visit: Payer: BC Managed Care – PPO | Admitting: Internal Medicine

## 2021-03-22 ENCOUNTER — Encounter: Payer: Self-pay | Admitting: Internal Medicine

## 2021-04-01 ENCOUNTER — Ambulatory Visit: Payer: BC Managed Care – PPO | Admitting: Internal Medicine

## 2021-04-22 ENCOUNTER — Ambulatory Visit: Payer: BC Managed Care – PPO | Admitting: Internal Medicine

## 2021-04-22 ENCOUNTER — Other Ambulatory Visit: Payer: Self-pay

## 2021-04-22 ENCOUNTER — Encounter: Payer: Self-pay | Admitting: Internal Medicine

## 2021-04-22 VITALS — BP 130/108 | HR 93 | Ht 65.0 in | Wt 197.8 lb

## 2021-04-22 DIAGNOSIS — F411 Generalized anxiety disorder: Secondary | ICD-10-CM | POA: Diagnosis not present

## 2021-04-22 DIAGNOSIS — Z23 Encounter for immunization: Secondary | ICD-10-CM | POA: Diagnosis not present

## 2021-04-22 DIAGNOSIS — E118 Type 2 diabetes mellitus with unspecified complications: Secondary | ICD-10-CM

## 2021-04-22 DIAGNOSIS — E1169 Type 2 diabetes mellitus with other specified complication: Secondary | ICD-10-CM

## 2021-04-22 DIAGNOSIS — I10 Essential (primary) hypertension: Secondary | ICD-10-CM | POA: Diagnosis not present

## 2021-04-22 DIAGNOSIS — F3341 Major depressive disorder, recurrent, in partial remission: Secondary | ICD-10-CM

## 2021-04-22 DIAGNOSIS — E785 Hyperlipidemia, unspecified: Secondary | ICD-10-CM

## 2021-04-22 LAB — POCT GLYCOSYLATED HEMOGLOBIN (HGB A1C): Hemoglobin A1C: 6 % — AB (ref 4.0–5.6)

## 2021-04-22 MED ORDER — BUPROPION HCL ER (XL) 300 MG PO TB24: 300.0000 mg | ORAL_TABLET | Freq: Every day | ORAL | 1 refills | Status: DC

## 2021-04-22 MED ORDER — ROSUVASTATIN CALCIUM 10 MG PO TABS
10.0000 mg | ORAL_TABLET | Freq: Every day | ORAL | 1 refills | Status: DC
Start: 2021-04-22 — End: 2021-10-07

## 2021-04-22 MED ORDER — ESCITALOPRAM OXALATE 20 MG PO TABS: 20.0000 mg | ORAL_TABLET | Freq: Every day | ORAL | 1 refills | Status: DC

## 2021-04-22 MED ORDER — BUSPIRONE HCL 10 MG PO TABS: 10.0000 mg | ORAL_TABLET | Freq: Two times a day (BID) | ORAL | 1 refills | Status: DC

## 2021-04-22 MED ORDER — METFORMIN HCL ER 500 MG PO TB24: 500.0000 mg | ORAL_TABLET | Freq: Every day | ORAL | 1 refills | Status: DC

## 2021-04-22 MED ORDER — ALPRAZOLAM 0.25 MG PO TABS: 0.2500 mg | ORAL_TABLET | Freq: Two times a day (BID) | ORAL | 2 refills | Status: DC | PRN

## 2021-04-22 NOTE — Progress Notes (Signed)
Date:  04/22/2021   Name:  Amanda Bautista   DOB:  [birth year]   MRN:  [id number]   Chief Complaint: Diabetes, Hypertension, and Depression  Diabetes She presents for her follow-up diabetic visit. She has type 2 diabetes mellitus. Her disease course has been stable. Hypoglycemia symptoms include nervousness/anxiousness. Pertinent negatives for hypoglycemia include no headaches or tremors. Pertinent negatives for diabetes include no chest pain, no fatigue, no polydipsia and no polyuria. There are no hypoglycemic complications. Current diabetic treatment includes oral agent (monotherapy). She is compliant with treatment all of the time.  Hypertension This is a chronic problem. The current episode started more than 1 year ago. The problem has been waxing and waning since onset. The problem is uncontrolled. Associated symptoms include anxiety. Pertinent negatives include no chest pain, headaches, palpitations or shortness of breath.  Hyperlipidemia Pertinent negatives include no chest pain or shortness of breath.  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 Symptoms include excessive worry, insomnia, irritability, nervous/anxious behavior and restlessness. Patient reports no chest pain, palpitations or shortness of breath. Symptoms occur constantly. The severity of symptoms is causing significant distress. The quality of sleep is fair.     Lab Results  Component Value Date   CREATININE 0.68 10/12/2020   BUN 12 10/12/2020   NA 141 10/12/2020   K 4.5 10/12/2020   CL 100 10/12/2020   CO2 23 10/12/2020   Lab Results  Component Value Date   CHOL 185 10/12/2020   HDL 48 10/12/2020   LDLCALC 106 (H) 10/12/2020   TRIG 177 (H) 10/12/2020   CHOLHDL 3.9 10/12/2020   Lab Results  Component Value Date   TSH 1.550 10/12/2020   Lab Results  Component Value Date    HGBA1C 6.0 (A) 04/22/2021   Lab Results  Component Value Date   WBC 6.6 10/12/2020   HGB 14.4 10/12/2020   HCT 44.6 10/12/2020   MCV 91 10/12/2020   PLT 308 10/12/2020   Lab Results  Component Value Date   ALT 34 (H) 10/12/2020   AST 19 10/12/2020   ALKPHOS 95 10/12/2020   BILITOT 0.4 10/12/2020     Review of Systems  Constitutional:  Positive for irritability. Negative for appetite change, fatigue, fever and unexpected weight change.  HENT:  Negative for hearing loss, tinnitus and trouble swallowing.   Eyes:  Negative for visual disturbance.  Respiratory:  Negative for cough, chest tightness and shortness of breath.   Cardiovascular:  Negative for chest pain, palpitations and leg swelling.  Gastrointestinal:  Negative for abdominal pain.  Endocrine: Negative for polydipsia and polyuria.  Genitourinary:  Negative for dysuria and hematuria.  Musculoskeletal:  Negative for arthralgias.  Neurological:  Negative for tremors, numbness and headaches.  Psychiatric/Behavioral:  Positive for dysphoric mood and sleep disturbance. The patient is nervous/anxious and has insomnia.    Patient Active Problem List   Diagnosis Date Noted   Sprain of ulnar collateral ligament of metacarpophalangeal (MCP) joint of left thumb 02/22/2021   S/P TAH-BSO 06/27/2020   Xerosis of skin 11/11/2018   Hypertensive retinopathy 05/06/2018   Type II diabetes mellitus with complication (HCC) 04/15/2018   Benign essential HTN 04/15/2018   Hyperlipidemia associated with type 2 diabetes mellitus (HCC) 04/15/2018   Depression, major, recurrent, in partial remission (HCC) 04/15/2018   Generalized anxiety disorder 04/15/2018   Mild intermittent asthma, uncomplicated 04/15/2018  No Known Allergies  Past Surgical History:  Procedure Laterality Date   ABDOMINAL HYSTERECTOMY     OOPHORECTOMY     one ovary left    Social History   Tobacco Use   Smoking status: Former    Types: Cigarettes    Quit  date: 10/18/2005    Years since quitting: 15.5   Smokeless tobacco: Never  Vaping Use   Vaping Use: Never used  Substance Use Topics   Alcohol use: Never   Drug use: Never     Medication list has been reviewed and updated.  Current Meds  Medication Sig   albuterol (VENTOLIN HFA) 108 (90 Base) MCG/ACT inhaler INHALE 1 TO 2 PUFFS INTO THE LUNGS EVERY 6 HOURS AS NEEDED FOR WHEEZING OR SHORTNESS OF BREATH   ALPRAZolam (XANAX) 0.25 MG tablet TAKE 1 TABLET(0.25 MG) BY MOUTH AT BEDTIME AS NEEDED FOR ANXIETY   buPROPion (WELLBUTRIN XL) 300 MG 24 hr tablet Take 1 tablet (300 mg total) by mouth daily.   busPIRone (BUSPAR) 10 MG tablet Take 1 tablet (10 mg total) by mouth 2 (two) times daily.   escitalopram (LEXAPRO) 20 MG tablet Take 1 tablet (20 mg total) by mouth daily.   lisinopril (ZESTRIL) 10 MG tablet Take 1 tablet (10 mg total) by mouth daily.   metFORMIN (GLUCOPHAGE-XR) 500 MG 24 hr tablet Take 1 tablet (500 mg total) by mouth daily with breakfast.   rosuvastatin (CRESTOR) 10 MG tablet Take 1 tablet (10 mg total) by mouth daily.   triamcinolone cream (KENALOG) 0.1 % Apply 1 application topically 2 (two) times daily.    PHQ 2/9 Scores 04/22/2021 11/16/2020 10/12/2020 04/09/2020  PHQ - 2 Score 2 0 2 2  PHQ- 9 Score 5 0 2 3  Exception Documentation - - - -    GAD 7 : Generalized Anxiety Score 04/22/2021 11/16/2020 10/12/2020 04/09/2020  Nervous, Anxious, on Edge 2 0 1 3  Control/stop worrying 2 0 1 3  Worry too much - different things 2 0 1 3  Trouble relaxing 2 0 0 1  Restless 2 0 0 1  Easily annoyed or irritable 2 0 0 0  Afraid - awful might happen 2 0 0 3  Total GAD 7 Score 14 0 3 14  Anxiety Difficulty Not difficult at all - - Somewhat difficult    BP Readings from Last 3 Encounters:  04/22/21 (!) 130/108  11/16/20 (!) 124/98  10/12/20 118/84    Physical Exam Vitals and nursing note reviewed.  Constitutional:      General: She is not in acute distress.    Appearance: She  is well-developed.  HENT:     Head: Normocephalic and atraumatic.  Cardiovascular:     Rate and Rhythm: Normal rate and regular rhythm.     Pulses: Normal pulses.  Pulmonary:     Effort: Pulmonary effort is normal. No respiratory distress.     Breath sounds: No wheezing or rhonchi.  Musculoskeletal:     Cervical back: Normal range of motion.     Right lower leg: No edema.     Left lower leg: No edema.  Lymphadenopathy:     Cervical: No cervical adenopathy.  Skin:    General: Skin is warm and dry.     Findings: No rash.  Neurological:     Mental Status: She is alert and oriented to person, place, and time.  Psychiatric:        Mood and Affect: Mood is anxious and depressed.  Speech: Speech normal.        Behavior: Behavior normal.        Thought Content: Thought content does not include suicidal ideation. Thought content does not include suicidal plan.    Wt Readings from Last 3 Encounters:  04/22/21 197 lb 12.8 oz (89.7 kg)  11/16/20 209 lb (94.8 kg)  10/12/20 213 lb (96.6 kg)    BP (!) 130/108   Pulse 93   Ht 5\' 5"  (1.651 m)   Wt 197 lb 12.8 oz (89.7 kg)   SpO2 98%   BMI 32.92 kg/m   Assessment and Plan: 1. Type II diabetes mellitus with complication (HCC) Clinically stable by exam and report without s/s of hypoglycemia. DM complicated by hypertension and dyslipidemia. Tolerating medications well without side effects or other concerns. She has lost 16 lbs this year. - POCT glycosylated hemoglobin (Hb A1C) = 6.0 down from 6.3 - metFORMIN (GLUCOPHAGE-XR) 500 MG 24 hr tablet; Take 1 tablet (500 mg total) by mouth daily with breakfast.  Dispense: 90 tablet; Refill: 1  2. Benign essential HTN BP elevated today due to current stressors at school Will not change meds at this time - continue to monitor and work on stress reduction  3. Hyperlipidemia associated with type 2 diabetes mellitus (HCC) Tolerating statin well without side effects - rosuvastatin  (CRESTOR) 10 MG tablet; Take 1 tablet (10 mg total) by mouth daily.  Dispense: 90 tablet; Refill: 1  4. Generalized anxiety disorder Recent worsening due to work situation Will increase Xanax to bid prn for the short term. - ALPRAZolam (XANAX) 0.25 MG tablet; Take 1 tablet (0.25 mg total) by mouth 2 (two) times daily as needed for anxiety.  Dispense: 60 tablet; Refill: 2  5. Depression, major, recurrent, in partial remission (HCC) Depression is fairly well controlled on triple therapy - will not change the dose at this time. Encourage her to work to resolve her work related stressors - escitalopram (LEXAPRO) 20 MG tablet; Take 1 tablet (20 mg total) by mouth daily.  Dispense: 90 tablet; Refill: 1 - busPIRone (BUSPAR) 10 MG tablet; Take 1 tablet (10 mg total) by mouth 2 (two) times daily.  Dispense: 180 tablet; Refill: 1 - buPROPion (WELLBUTRIN XL) 300 MG 24 hr tablet; Take 1 tablet (300 mg total) by mouth daily.  Dispense: 90 tablet; Refill: 1   Partially dictated using . Any errors are unintentional.  Animal nutritionist, MD [facility]  04/22/2021

## 2021-04-29 ENCOUNTER — Other Ambulatory Visit: Payer: Self-pay | Admitting: Internal Medicine

## 2021-04-29 DIAGNOSIS — J4520 Mild intermittent asthma, uncomplicated: Secondary | ICD-10-CM

## 2021-04-29 MED ORDER — FLOVENT HFA 220 MCG/ACT IN AERO: 2.0000 | INHALATION_SPRAY | Freq: Every day | RESPIRATORY_TRACT | 12 refills | Status: DC

## 2021-05-07 ENCOUNTER — Encounter: Payer: Self-pay | Admitting: Internal Medicine

## 2021-05-08 ENCOUNTER — Other Ambulatory Visit: Payer: Self-pay | Admitting: Internal Medicine

## 2021-05-08 DIAGNOSIS — J4520 Mild intermittent asthma, uncomplicated: Secondary | ICD-10-CM

## 2021-05-08 MED ORDER — FLUTICASONE-SALMETEROL 250-50 MCG/ACT IN AEPB: 1.0000 | INHALATION_SPRAY | Freq: Two times a day (BID) | RESPIRATORY_TRACT | 5 refills | Status: DC

## 2021-05-15 ENCOUNTER — Other Ambulatory Visit: Payer: Self-pay | Admitting: Internal Medicine

## 2021-05-15 DIAGNOSIS — J4520 Mild intermittent asthma, uncomplicated: Secondary | ICD-10-CM

## 2021-05-16 NOTE — Telephone Encounter (Signed)
Requested medication (s) are due for refill today - yes  Requested medication (s) are on the active medication list -yes  Future visit scheduled -yes  Last refill: 03/06/20 6.79 g 3RF  Notes to clinic: Request RF: expired Rx  Requested Prescriptions  Pending Prescriptions Disp Refills   albuterol (VENTOLIN HFA) 108 (90 Base) MCG/ACT inhaler [Pharmacy Med Name: ALBUTEROL HFA INH (200 PUFFS) 6.7GM] 6.7 g 3    Sig: INHALE 1 TO 2 PUFFS INTO THE LUNGS EVERY 6 HOURS AS NEEDED FOR WHEEZING OR SHORTNESS OF BREATH     Pulmonology:  Beta Agonists Failed - 05/15/2021 10:42 PM      Failed - One inhaler should last at least one month. If the patient is requesting refills earlier, contact the patient to check for uncontrolled symptoms.      Passed - Valid encounter within last 12 months    Recent Outpatient Visits           3 weeks ago Type II diabetes mellitus with complication [facility])   [facility] Reubin Milan, MD   6 months ago Acute cystitis without hematuria   [facility] Reubin Milan, MD   7 months ago Annual physical exam   [facility] Reubin Milan, MD   1 year ago Generalized anxiety disorder   [facility] Reubin Milan, MD   1 year ago Type II diabetes mellitus with complication [facility])   [facility] Reubin Milan, MD       Future Appointments             In 3 months Judithann Graves Nyoka Cowden, MD [facility]   In 5 months Reubin Milan, MD [facility], [facility]               Requested Prescriptions  Pending Prescriptions Disp Refills   albuterol (VENTOLIN HFA) 108 (90 Base) MCG/ACT inhaler [Pharmacy Med Name: ALBUTEROL HFA INH (200 PUFFS) 6.7GM] 6.7 g 3    Sig: INHALE 1 TO 2 PUFFS INTO THE LUNGS EVERY 6 HOURS AS NEEDED FOR WHEEZING OR SHORTNESS OF BREATH     Pulmonology:  Beta Agonists Failed - 05/15/2021 10:42 PM      Failed - One inhaler should last at least one month. If the  patient is requesting refills earlier, contact the patient to check for uncontrolled symptoms.      Passed - Valid encounter within last 12 months    Recent Outpatient Visits           3 weeks ago Type II diabetes mellitus with complication [facility])   [facility] Reubin Milan, MD   6 months ago Acute cystitis without hematuria   [facility] Reubin Milan, MD   7 months ago Annual physical exam   [facility] Reubin Milan, MD   1 year ago Generalized anxiety disorder   [facility] Reubin Milan, MD   1 year ago Type II diabetes mellitus with complication [facility])   [facility] Reubin Milan, MD       Future Appointments             In 3 months Judithann Graves Nyoka Cowden, MD [facility], [facility]   In 5 months Judithann Graves Nyoka Cowden, MD [facility], [facility]

## 2021-06-01 ENCOUNTER — Other Ambulatory Visit: Payer: Self-pay

## 2021-06-01 ENCOUNTER — Encounter: Payer: Self-pay | Admitting: Internal Medicine

## 2021-06-01 ENCOUNTER — Ambulatory Visit: Payer: BC Managed Care – PPO | Admitting: Internal Medicine

## 2021-06-01 VITALS — BP 132/80 | HR 94 | Temp 98.1°F | Ht 65.0 in | Wt 209.4 lb

## 2021-06-01 DIAGNOSIS — J0100 Acute maxillary sinusitis, unspecified: Secondary | ICD-10-CM | POA: Diagnosis not present

## 2021-06-01 MED ORDER — PROMETHAZINE-DM 6.25-15 MG/5ML PO SYRP
5.0000 mL | ORAL_SOLUTION | Freq: Four times a day (QID) | ORAL | 0 refills | Status: DC | PRN
Start: 2021-06-01 — End: 2021-06-20

## 2021-06-01 MED ORDER — AMOXICILLIN-POT CLAVULANATE 875-125 MG PO TABS: 1.0000 | ORAL_TABLET | Freq: Two times a day (BID) | ORAL | 0 refills | Status: AC

## 2021-06-01 NOTE — Progress Notes (Signed)
Date:  06/01/2021   Name:  Amanda Bautista   DOB:  23-May-1966   MRN:  [id number]   Chief Complaint: Sinusitis  Sinusitis This is a new problem. The current episode started 1 to 4 weeks ago. The problem has been gradually worsening since onset. Associated symptoms include congestion, coughing ([place]ellow [place]), ear pain (Bilateral), headaches, shortness of breath, sinus pressure and sneezing. Pertinent negatives include no chills or sore throat. Past treatments include oral decongestants and saline sprays. The treatment provided no relief. Not tested for Covid or the Flu since Symptoms began.  Lab Results  Component Value Date   NA 141 10/12/2020   K 4.5 10/12/2020   CO2 23 10/12/2020   GLUCOSE 108 (H) 10/12/2020   BUN 12 10/12/2020   CREATININE 0.68 10/12/2020   CALCIUM 9.8 10/12/2020   EGFR 102 10/12/2020   GFRNONAA 91 12/30/2019   Lab Results  Component Value Date   CHOL 185 10/12/2020   HDL 48 10/12/2020   LDLCALC 106 (H) 10/12/2020   TRIG 177 (H) 10/12/2020   CHOLHDL 3.9 10/12/2020   Lab Results  Component Value Date   TSH 1.550 10/12/2020   Lab Results  Component Value Date   HGBA1C 6.0 (A) 04/22/2021   Lab Results  Component Value Date   WBC 6.6 10/12/2020   HGB 14.4 10/12/2020   HCT 44.6 10/12/2020   MCV 91 10/12/2020   PLT 308 10/12/2020   Lab Results  Component Value Date   ALT 34 (H) 10/12/2020   AST 19 10/12/2020   ALKPHOS 95 10/12/2020   BILITOT 0.4 10/12/2020   No results found for: 25OHVITD2, 25OHVITD3, VD25OH   Review of Systems  Constitutional:  Negative for chills, fatigue and fever.  HENT:  Positive for congestion, ear pain (Bilateral), sinus pressure and sneezing. Negative for sore throat and trouble swallowing.   Respiratory:  Positive for cough ([place]ellow [place]), shortness of breath and wheezing.   Gastrointestinal:  Negative for abdominal pain, diarrhea and vomiting.  Neurological:  Positive for headaches. Negative for dizziness.   Psychiatric/Behavioral:  Negative for dysphoric mood. The patient is not nervous/anxious.    Patient Active Problem List   Diagnosis Date Noted   Sprain of ulnar collateral ligament of metacarpophalangeal (MCP) joint of left thumb 02/22/2021   S/P TAH-BSO 06/27/2020   Xerosis of skin 11/11/2018   Hypertensive retinopathy 05/06/2018   Type II diabetes mellitus with complication ([place]) 09/38/1829   Benign essential HTN 04/15/2018   Hyperlipidemia associated with type 2 diabetes mellitus ([place]) 04/15/2018   Depression, major, recurrent, in partial remission ([place]) 04/15/2018   Generalized anxiety disorder 04/15/2018   Mild intermittent asthma, uncomplicated 93/71/6967    No Known Allergies  Past Surgical History:  Procedure Laterality Date   ABDOMINAL HYSTERECTOMY     OOPHORECTOMY     one ovary left    Social History   Tobacco Use   Smoking status: Former    Types: Cigarettes    Quit date: 10/18/2005    Years since quitting: 15.6   Smokeless tobacco: Never  Vaping Use   Vaping Use: Never used  Substance Use Topics   Alcohol use: Never   Drug use: Never     Medication list has been reviewed and updated.  Current Meds  Medication Sig   albuterol (VENTOLIN HFA) 108 (90 Base) MCG/ACT inhaler INHALE 1 TO 2 PUFFS INTO THE LUNGS EVERY 6 HOURS AS NEEDED FOR WHEEZING OR SHORTNESS OF BREATH   ALPRAZolam (XANAX) 0.25  MG tablet Take 1 tablet (0.25 mg total) by mouth 2 (two) times daily as needed for anxiety.   amoxicillin-clavulanate (AUGMENTIN) 875-125 MG tablet Take 1 tablet by mouth 2 (two) times daily for 10 days.   buPROPion (WELLBUTRIN XL) 300 MG 24 hr tablet Take 1 tablet (300 mg total) by mouth daily.   busPIRone (BUSPAR) 10 MG tablet Take 1 tablet (10 mg total) by mouth 2 (two) times daily.   escitalopram (LEXAPRO) 20 MG tablet Take 1 tablet (20 mg total) by mouth daily.   fluticasone-salmeterol (ADVAIR DISKUS) 250-50 MCG/ACT AEPB Inhale 1 puff into the lungs in the morning  and at bedtime.   lisinopril (ZESTRIL) 10 MG tablet Take 1 tablet (10 mg total) by mouth daily.   meloxicam (MOBIC) 15 MG tablet Take 15 mg by mouth daily.   metFORMIN (GLUCOPHAGE-XR) 500 MG 24 hr tablet Take 1 tablet (500 mg total) by mouth daily with breakfast.   promethazine-dextromethorphan (PROMETHAZINE-DM) 6.25-15 MG/5ML syrup Take 5 mLs by mouth 4 (four) times daily as needed for cough.   rosuvastatin (CRESTOR) 10 MG tablet Take 1 tablet (10 mg total) by mouth daily.   triamcinolone cream (KENALOG) 0.1 % Apply 1 application topically 2 (two) times daily.    PHQ 2/9 Scores 06/01/2021 04/22/2021 11/16/2020 10/12/2020  PHQ - 2 Score 0 2 0 2  PHQ- 9 Score 0 5 0 2  Exception Documentation - - - -    GAD 7 : Generalized Anxiety Score 06/01/2021 04/22/2021 11/16/2020 10/12/2020  Nervous, Anxious, on Edge 1 2 0 1  Control/stop worrying 1 2 0 1  Worry too much - different things 1 2 0 1  Trouble relaxing 1 2 0 0  Restless 0 2 0 0  Easily annoyed or irritable 1 2 0 0  Afraid - awful might happen 0 2 0 0  Total GAD 7 Score 5 14 0 3  Anxiety Difficulty Not difficult at all Not difficult at all - -    BP Readings from Last 3 Encounters:  06/01/21 132/80  04/22/21 (!) 130/108  11/16/20 (!) 124/98    Physical Exam Constitutional:      Appearance: Normal appearance. She is well-developed.  HENT:     Right Ear: Ear canal and external ear normal. Tympanic membrane is not erythematous or retracted.     Left Ear: Ear canal and external ear normal. Tympanic membrane is not erythematous or retracted.     Nose:     Right Sinus: Maxillary sinus tenderness and frontal sinus tenderness present.     Left Sinus: Maxillary sinus tenderness and frontal sinus tenderness present.     Mouth/Throat:     Mouth: No oral lesions.     Pharynx: Uvula midline. Posterior oropharyngeal erythema present. No oropharyngeal exudate.  Cardiovascular:     Rate and Rhythm: Normal rate and regular rhythm.      Pulses: Normal pulses.     Heart sounds: Normal heart sounds.  Pulmonary:     Effort: Pulmonary effort is normal.     Breath sounds: Normal breath sounds. No wheezing, rhonchi or rales.  Musculoskeletal:     Cervical back: Normal range of motion.  Lymphadenopathy:     Cervical: No cervical adenopathy.  Neurological:     Mental Status: She is alert and oriented to person, place, and time.    Wt Readings from Last 3 Encounters:  06/01/21 209 lb 6.4 oz (95 kg)  04/22/21 197 lb 12.8 oz (89.7 kg)  11/16/20  209 lb (94.8 kg)    BP 132/80   Pulse 94   Temp 98.1 F (36.7 C) (Oral)   Ht 5' 5"  (1.651 m)   Wt 209 lb 6.4 oz (95 kg)   SpO2 95%   BMI 34.85 kg/m   Assessment and Plan: 1. Acute non-recurrent maxillary sinusitis Continue Advair, Albuterol, allergy medications, fluids and rest Not to be out of work today and tomorrow. - amoxicillin-clavulanate (AUGMENTIN) 875-125 MG tablet; Take 1 tablet by mouth 2 (two) times daily for 10 days.  Dispense: 20 tablet; Refill: 0 - promethazine-dextromethorphan (PROMETHAZINE-DM) 6.25-15 MG/5ML syrup; Take 5 mLs by mouth 4 (four) times daily as needed for cough.  Dispense: 180 mL; Refill: 0   Partially dictated using Editor, commissioning. Any errors are unintentional.  Halina Maidens, MD [facility]  06/01/2021

## 2021-06-16 ENCOUNTER — Encounter: Payer: Self-pay | Admitting: Internal Medicine

## 2021-06-20 ENCOUNTER — Other Ambulatory Visit: Payer: Self-pay

## 2021-06-20 ENCOUNTER — Encounter: Payer: Self-pay | Admitting: Internal Medicine

## 2021-06-20 ENCOUNTER — Ambulatory Visit: Payer: BC Managed Care – PPO | Admitting: Internal Medicine

## 2021-06-20 VITALS — BP 144/70 | HR 93 | Ht 65.0 in | Wt 208.6 lb

## 2021-06-20 DIAGNOSIS — F411 Generalized anxiety disorder: Secondary | ICD-10-CM

## 2021-06-20 DIAGNOSIS — F3341 Major depressive disorder, recurrent, in partial remission: Secondary | ICD-10-CM

## 2021-06-20 DIAGNOSIS — J4520 Mild intermittent asthma, uncomplicated: Secondary | ICD-10-CM | POA: Diagnosis not present

## 2021-06-20 MED ORDER — MONTELUKAST SODIUM 10 MG PO TABS: 10.0000 mg | ORAL_TABLET | Freq: Every day | ORAL | 3 refills | Status: DC

## 2021-06-20 NOTE — Progress Notes (Signed)
Date:  06/20/2021   Name:  Amanda Bautista   DOB:  December 31, 1965   MRN:  [id number]   Chief Complaint: Asthma  Anxiety Presents for follow-up visit. Symptoms include decreased concentration, insomnia, irritability, nervous/anxious behavior, palpitations, panic, restlessness and shortness of breath. Patient reports no chest pain. Symptoms occur constantly. The severity of symptoms is causing significant distress (mostly related to work stress and lack of support from administration).   Her past medical history is significant for asthma.  Depression        This is a chronic problem.  The current episode started in the past 7 days.   The onset quality is gradual.   The problem occurs constantly.  The problem has been rapidly worsening since onset.  Associated symptoms include decreased concentration, fatigue, hopelessness, insomnia, irritable, restlessness and appetite change.     The symptoms are aggravated by work stress.  Past medical history includes anxiety.   Asthma She complains of chest tightness, shortness of breath and wheezing. There is no cough. This is a recurrent problem. Associated symptoms include appetite change. Pertinent negatives include no chest pain. 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 Her symptoms are alleviated by beta-agonist and steroid inhaler. She reports moderate improvement on treatment. Her past medical history is significant for asthma.   Lab Results  Component Value Date   NA 141 10/12/2020   K 4.5 10/12/2020   CO2 23 10/12/2020   GLUCOSE 108 (H) 10/12/2020   BUN 12 10/12/2020   CREATININE 0.68 10/12/2020   CALCIUM 9.8 10/12/2020   EGFR 102 10/12/2020   GFRNONAA 91 12/30/2019   Lab Results  Component Value Date   CHOL 185 10/12/2020   HDL 48 10/12/2020   LDLCALC 106 (H) 10/12/2020   TRIG 177 (H) 10/12/2020   CHOLHDL 3.9 10/12/2020   Lab Results  Component Value Date    TSH 1.550 10/12/2020   Lab Results  Component Value Date   HGBA1C 6.0 (A) 04/22/2021   Lab Results  Component Value Date   WBC 6.6 10/12/2020   HGB 14.4 10/12/2020   HCT 44.6 10/12/2020   MCV 91 10/12/2020   PLT 308 10/12/2020   Lab Results  Component Value Date   ALT 34 (H) 10/12/2020   AST 19 10/12/2020   ALKPHOS 95 10/12/2020   BILITOT 0.4 10/12/2020   No results found for: 25OHVITD2, 25OHVITD3, VD25OH   Review of Systems  Constitutional:  Positive for appetite change, fatigue and irritability.  Respiratory:  Positive for chest tightness, shortness of breath and wheezing. Negative for cough and choking.   Cardiovascular:  Positive for palpitations. Negative for chest pain.  Psychiatric/Behavioral:  Positive for decreased concentration and depression. The patient is nervous/anxious and has insomnia.    Patient Active Problem List   Diagnosis Date Noted   Sprain of ulnar collateral ligament of metacarpophalangeal (MCP) joint of left thumb 02/22/2021   S/P TAH-BSO 06/27/2020   Xerosis of skin 11/11/2018   Hypertensive retinopathy 05/06/2018   Type II diabetes mellitus with complication ([place]) 62/09/5595   Benign essential HTN 04/15/2018   Hyperlipidemia associated with type 2 diabetes mellitus ([place]) 04/15/2018   Depression, major, recurrent, in partial remission ([place]) 04/15/2018   Generalized anxiety disorder 04/15/2018   Mild intermittent asthma, uncomplicated 41/63/8453    No Known Allergies  Past Surgical History:  Procedure Laterality Date   ABDOMINAL HYSTERECTOMY     OOPHORECTOMY  one ovary left    Social History   Tobacco Use   Smoking status: Former    Types: Cigarettes    Quit date: 10/18/2005    Years since quitting: 15.6   Smokeless tobacco: Never  Vaping Use   Vaping Use: Never used  Substance Use Topics   Alcohol use: Never   Drug use: Never     Medication list has been reviewed and updated.  Current Meds  Medication Sig   albuterol  (VENTOLIN HFA) 108 (90 Base) MCG/ACT inhaler INHALE 1 TO 2 PUFFS INTO THE LUNGS EVERY 6 HOURS AS NEEDED FOR WHEEZING OR SHORTNESS OF BREATH   ALPRAZolam (XANAX) 0.25 MG tablet Take 1 tablet (0.25 mg total) by mouth 2 (two) times daily as needed for anxiety.   buPROPion (WELLBUTRIN XL) 300 MG 24 hr tablet Take 1 tablet (300 mg total) by mouth daily.   busPIRone (BUSPAR) 10 MG tablet Take 1 tablet (10 mg total) by mouth 2 (two) times daily.   escitalopram (LEXAPRO) 20 MG tablet Take 1 tablet (20 mg total) by mouth daily.   fluticasone-salmeterol (ADVAIR DISKUS) 250-50 MCG/ACT AEPB Inhale 1 puff into the lungs in the morning and at bedtime.   lisinopril (ZESTRIL) 10 MG tablet Take 1 tablet (10 mg total) by mouth daily.   meloxicam (MOBIC) 15 MG tablet Take 15 mg by mouth daily.   metFORMIN (GLUCOPHAGE-XR) 500 MG 24 hr tablet Take 1 tablet (500 mg total) by mouth daily with breakfast.   rosuvastatin (CRESTOR) 10 MG tablet Take 1 tablet (10 mg total) by mouth daily.   triamcinolone cream (KENALOG) 0.1 % Apply 1 application topically 2 (two) times daily.    PHQ 2/9 Scores 06/20/2021 06/01/2021 04/22/2021 11/16/2020  PHQ - 2 Score 6 0 2 0  PHQ- 9 Score 23 0 5 0  Exception Documentation - - - -    GAD 7 : Generalized Anxiety Score 06/20/2021 06/01/2021 04/22/2021 11/16/2020  Nervous, Anxious, on Edge _0 0  Control/stop worrying _1 0  Worry too much - different things _2 0  Trouble relaxing _3 0  Restless 3 0 2 0  Easily annoyed or irritable _4 0  Afraid - awful might happen 3 0 2 0  Total GAD 7 Score _5 0  Anxiety Difficulty Extremely difficult Not difficult at all Not difficult at all -    BP Readings from Last 3 Encounters:  06/20/21 (!) 144/70  06/01/21 132/80  04/22/21 (!) 130/108    Physical Exam Vitals and nursing note reviewed.  Constitutional:      General: She is irritable. She is not in acute distress.    Appearance: Normal appearance. She is  well-developed.  HENT:     Head: Normocephalic and atraumatic.  Cardiovascular:     Rate and Rhythm: Normal rate and regular rhythm.  Pulmonary:     Effort: Pulmonary effort is normal. No respiratory distress.     Breath sounds: No wheezing or rhonchi.  Musculoskeletal:     Cervical back: Normal range of motion.     Right lower leg: No edema.     Left lower leg: No edema.  Skin:    General: Skin is warm and dry.     Findings: No rash.  Neurological:     Mental Status: She is alert and oriented to person, place, and time.  Psychiatric:        Attention and Perception: Attention  normal.        Mood and Affect: Mood is anxious.        Speech: Speech normal.        Behavior: Behavior normal.        Thought Content: Thought content normal.    Wt Readings from Last 3 Encounters:  06/20/21 208 lb 9.6 oz (94.6 kg)  06/01/21 209 lb 6.4 oz (95 kg)  04/22/21 197 lb 12.8 oz (89.7 kg)    BP (!) 144/70    Pulse 93    Ht _0  (1.651 m)    Wt 208 lb 9.6 oz (94.6 kg)    SpO2 97%    BMI 34.71 kg/m   Assessment and Plan: 1. Mild intermittent asthma, uncomplicated Continue Advair and Albuterol Add Singulair daily. Note to be out of work for the next three days - montelukast (SINGULAIR) 10 MG tablet; Take 1 tablet (10 mg total) by mouth at bedtime.  Dispense: 30 tablet; Refill: 3  2. Generalized anxiety disorder Moderate sx exacerbated by work stress Continue current medications with Buspar and Bupropion Xanax as needed  3. Depression, major, recurrent, in partial remission ([place]) Depressive sx controlled with Lexapro.   Partially dictated using Editor, commissioning. Any errors are unintentional.  Halina Maidens, MD [facility]  06/20/2021

## 2021-07-05 ENCOUNTER — Encounter: Payer: Self-pay | Admitting: Internal Medicine

## 2021-08-01 ENCOUNTER — Encounter: Payer: Self-pay | Admitting: Internal Medicine

## 2021-08-01 ENCOUNTER — Other Ambulatory Visit: Payer: Self-pay

## 2021-08-01 ENCOUNTER — Ambulatory Visit (INDEPENDENT_AMBULATORY_CARE_PROVIDER_SITE_OTHER): Payer: BC Managed Care – PPO | Admitting: Internal Medicine

## 2021-08-01 VITALS — BP 124/98 | HR 85 | Temp 98.2°F | Ht 65.0 in | Wt 210.0 lb

## 2021-08-01 DIAGNOSIS — J0100 Acute maxillary sinusitis, unspecified: Secondary | ICD-10-CM

## 2021-08-01 DIAGNOSIS — J40 Bronchitis, not specified as acute or chronic: Secondary | ICD-10-CM

## 2021-08-01 MED ORDER — AZITHROMYCIN 250 MG PO TABS: ORAL_TABLET | ORAL | 0 refills | Status: AC

## 2021-08-01 MED ORDER — PROMETHAZINE-DM 6.25-15 MG/5ML PO SYRP: 5.0000 mL | ORAL_SOLUTION | Freq: Four times a day (QID) | ORAL | 0 refills | Status: DC | PRN

## 2021-08-01 NOTE — Progress Notes (Signed)
Date:  08/01/2021   Name:  Amanda Bautista   DOB:  May 03, 1966   MRN:  [id number]   Chief Complaint: Cough  Cough This is a new problem. Episode onset: 1 week. The problem has been unchanged. The problem occurs hourly. The cough is Non-productive. Associated symptoms include chills, nasal congestion, postnasal drip, rhinorrhea, a sore throat and wheezing. Pertinent negatives include no chest pain or fever. Associated symptoms comments: Chest congestion. She has tried prescription cough suppressant for the symptoms. The treatment provided mild relief.   Lab Results  Component Value Date   NA 141 10/12/2020   K 4.5 10/12/2020   CO2 23 10/12/2020   GLUCOSE 108 (H) 10/12/2020   BUN 12 10/12/2020   CREATININE 0.68 10/12/2020   CALCIUM 9.8 10/12/2020   EGFR 102 10/12/2020   GFRNONAA 91 12/30/2019   Lab Results  Component Value Date   CHOL 185 10/12/2020   HDL 48 10/12/2020   LDLCALC 106 (H) 10/12/2020   TRIG 177 (H) 10/12/2020   CHOLHDL 3.9 10/12/2020   Lab Results  Component Value Date   TSH 1.550 10/12/2020   Lab Results  Component Value Date   HGBA1C 6.0 (A) 04/22/2021   Lab Results  Component Value Date   WBC 6.6 10/12/2020   HGB 14.4 10/12/2020   HCT 44.6 10/12/2020   MCV 91 10/12/2020   PLT 308 10/12/2020   Lab Results  Component Value Date   ALT 34 (H) 10/12/2020   AST 19 10/12/2020   ALKPHOS 95 10/12/2020   BILITOT 0.4 10/12/2020   No results found for: 25OHVITD2, 25OHVITD3, VD25OH   Review of Systems  Constitutional:  Positive for chills and fatigue. Negative for fever.  HENT:  Positive for postnasal drip, rhinorrhea and sore throat.   Respiratory:  Positive for cough and wheezing.   Cardiovascular:  Negative for chest pain and palpitations.   Patient Active Problem List   Diagnosis Date Noted   Sprain of ulnar collateral ligament of metacarpophalangeal (MCP) joint of left thumb 02/22/2021   S/P TAH-BSO 06/27/2020   Xerosis of skin 11/11/2018    Hypertensive retinopathy 05/06/2018   Type II diabetes mellitus with complication ([place]) 90/30/0923   Benign essential HTN 04/15/2018   Hyperlipidemia associated with type 2 diabetes mellitus ([place]) 04/15/2018   Depression, major, recurrent, in partial remission ([place]) 04/15/2018   Generalized anxiety disorder 04/15/2018   Mild intermittent asthma, uncomplicated 30/12/6224    No Known Allergies  Past Surgical History:  Procedure Laterality Date   ABDOMINAL HYSTERECTOMY     OOPHORECTOMY     one ovary left    Social History   Tobacco Use   Smoking status: Former    Types: Cigarettes    Quit date: 10/18/2005    Years since quitting: 15.7   Smokeless tobacco: Never  Vaping Use   Vaping Use: Never used  Substance Use Topics   Alcohol use: Never   Drug use: Never     Medication list has been reviewed and updated.  Current Meds  Medication Sig   albuterol (VENTOLIN HFA) 108 (90 Base) MCG/ACT inhaler INHALE 1 TO 2 PUFFS INTO THE LUNGS EVERY 6 HOURS AS NEEDED FOR WHEEZING OR SHORTNESS OF BREATH   ALPRAZolam (XANAX) 0.25 MG tablet Take 1 tablet (0.25 mg total) by mouth 2 (two) times daily as needed for anxiety.   buPROPion (WELLBUTRIN XL) 300 MG 24 hr tablet Take 1 tablet (300 mg total) by mouth daily.   busPIRone (BUSPAR) 10  MG tablet Take 1 tablet (10 mg total) by mouth 2 (two) times daily.   escitalopram (LEXAPRO) 20 MG tablet Take 1 tablet (20 mg total) by mouth daily.   fluticasone-salmeterol (ADVAIR DISKUS) 250-50 MCG/ACT AEPB Inhale 1 puff into the lungs in the morning and at bedtime.   lisinopril (ZESTRIL) 10 MG tablet Take 1 tablet (10 mg total) by mouth daily.   meloxicam (MOBIC) 15 MG tablet Take 15 mg by mouth daily.   metFORMIN (GLUCOPHAGE-XR) 500 MG 24 hr tablet Take 1 tablet (500 mg total) by mouth daily with breakfast.   montelukast (SINGULAIR) 10 MG tablet Take 1 tablet (10 mg total) by mouth at bedtime.   rosuvastatin (CRESTOR) 10 MG tablet Take 1 tablet (10 mg  total) by mouth daily.   triamcinolone cream (KENALOG) 0.1 % Apply 1 application topically 2 (two) times daily.    PHQ 2/9 Scores 08/01/2021 06/20/2021 06/01/2021 04/22/2021  PHQ - 2 Score 0 6 0 2  PHQ- 9 Score 4 23 0 5  Exception Documentation - - - -    GAD 7 : Generalized Anxiety Score 08/01/2021 06/20/2021 06/01/2021 04/22/2021  Nervous, Anxious, on Edge 0 3 1 2   Control/stop worrying 0 3 1 2   Worry too much - different things 0 3 1 2   Trouble relaxing 0 3 1 2   Restless 0 3 0 2  Easily annoyed or irritable 0 3 1 2   Afraid - awful might happen 0 3 0 2  Total GAD 7 Score 0 21 5 14   Anxiety Difficulty - Extremely difficult Not difficult at all Not difficult at all    BP Readings from Last 3 Encounters:  08/01/21 (!) 124/98  06/20/21 (!) 144/70  06/01/21 132/80    Physical Exam Vitals and nursing note reviewed.  Constitutional:      General: She is not in acute distress.    Appearance: Normal appearance. She is well-developed.  HENT:     Head: Normocephalic and atraumatic.     Right Ear: Tympanic membrane and ear canal normal.     Left Ear: Tympanic membrane and ear canal normal.     Nose:     Right Sinus: No maxillary sinus tenderness or frontal sinus tenderness.     Left Sinus: No maxillary sinus tenderness or frontal sinus tenderness.  Cardiovascular:     Rate and Rhythm: Normal rate and regular rhythm.  Pulmonary:     Effort: Pulmonary effort is normal. No respiratory distress.     Breath sounds: No wheezing or rhonchi.  Musculoskeletal:     Cervical back: Normal range of motion.  Skin:    General: Skin is warm and dry.     Findings: No rash.  Neurological:     Mental Status: She is alert and oriented to person, place, and time.  Psychiatric:        Mood and Affect: Mood normal.        Behavior: Behavior normal.    Wt Readings from Last 3 Encounters:  08/01/21 210 lb (95.3 kg)  06/20/21 208 lb 9.6 oz (94.6 kg)  06/01/21 209 lb 6.4 oz (95 kg)    BP (!)  124/98    Pulse 85    Temp 98.2 F (36.8 C) (Oral)    Ht 5' 5"  (1.651 m)    Wt 210 lb (95.3 kg)    SpO2 96%    BMI 34.95 kg/m   Assessment and Plan: 1. Bronchitis Suspect this started as Covid 12 days  ago. Too late to treat with anti-viral medication at this point. Letter to be out of work from 1/27 - 08/02/21. - promethazine-dextromethorphan (PROMETHAZINE-DM) 6.25-15 MG/5ML syrup; Take 5 mLs by mouth 4 (four) times daily as needed for cough.  Dispense: 180 mL; Refill: 0 - azithromycin (ZITHROMAX Z-PAK) 250 MG tablet; UAD  Dispense: 6 each; Refill: 0   Partially dictated using Editor, commissioning. Any errors are unintentional.  Halina Maidens, MD [facility]  08/01/2021

## 2021-08-01 NOTE — Patient Instructions (Signed)
Mucinex-DM or plain to help loosen the chest congestion

## 2021-08-01 NOTE — Progress Notes (Deleted)
Date:  08/01/2021   Name:  Amanda Bautista   DOB:  1965/11/07   MRN:  [id number]   Chief Complaint: Cough  Cough This is a new problem. Episode onset: 1 week. Associated symptoms comments: Chest congestion .   Lab Results  Component Value Date   NA 141 10/12/2020   K 4.5 10/12/2020   CO2 23 10/12/2020   GLUCOSE 108 (H) 10/12/2020   BUN 12 10/12/2020   CREATININE 0.68 10/12/2020   CALCIUM 9.8 10/12/2020   EGFR 102 10/12/2020   GFRNONAA 91 12/30/2019   Lab Results  Component Value Date   CHOL 185 10/12/2020   HDL 48 10/12/2020   LDLCALC 106 (H) 10/12/2020   TRIG 177 (H) 10/12/2020   CHOLHDL 3.9 10/12/2020   Lab Results  Component Value Date   TSH 1.550 10/12/2020   Lab Results  Component Value Date   HGBA1C 6.0 (A) 04/22/2021   Lab Results  Component Value Date   WBC 6.6 10/12/2020   HGB 14.4 10/12/2020   HCT 44.6 10/12/2020   MCV 91 10/12/2020   PLT 308 10/12/2020   Lab Results  Component Value Date   ALT 34 (H) 10/12/2020   AST 19 10/12/2020   ALKPHOS 95 10/12/2020   BILITOT 0.4 10/12/2020   No results found for: 25OHVITD2, 25OHVITD3, VD25OH   Review of Systems  Respiratory:  Positive for cough.    Patient Active Problem List   Diagnosis Date Noted   Sprain of ulnar collateral ligament of metacarpophalangeal (MCP) joint of left thumb 02/22/2021   S/P TAH-BSO 06/27/2020   Xerosis of skin 11/11/2018   Hypertensive retinopathy 05/06/2018   Type II diabetes mellitus with complication ([place]) 26/20/3559   Benign essential HTN 04/15/2018   Hyperlipidemia associated with type 2 diabetes mellitus ([place]) 04/15/2018   Depression, major, recurrent, in partial remission ([place]) 04/15/2018   Generalized anxiety disorder 04/15/2018   Mild intermittent asthma, uncomplicated 74/16/3845    No Known Allergies  Past Surgical History:  Procedure Laterality Date   ABDOMINAL HYSTERECTOMY     OOPHORECTOMY     one ovary left    Social History   Tobacco Use    Smoking status: Former    Types: Cigarettes    Quit date: 10/18/2005    Years since quitting: 15.7   Smokeless tobacco: Never  Vaping Use   Vaping Use: Never used  Substance Use Topics   Alcohol use: Never   Drug use: Never     Medication list has been reviewed and updated.  No outpatient medications have been marked as taking for the 08/01/21 encounter (Office Visit) with Glean Hess, MD.    [facility] 2/9 Scores 06/20/2021 06/01/2021 04/22/2021 11/16/2020  PHQ - 2 Score 6 0 2 0  PHQ- 9 Score 23 0 5 0  Exception Documentation - - - -    GAD 7 : Generalized Anxiety Score 06/20/2021 06/01/2021 04/22/2021 11/16/2020  Nervous, Anxious, on Edge _0 0  Control/stop worrying _1 0  Worry too much - different things _2 0  Trouble relaxing _3 0  Restless 3 0 2 0  Easily annoyed or irritable _4 0  Afraid - awful might happen 3 0 2 0  Total GAD 7 Score _5 0  Anxiety Difficulty Extremely difficult Not difficult at all Not difficult at all -    BP Readings from Last 3 Encounters:  06/20/21 (!) 144/70  06/01/21  132/80  04/22/21 (!) 130/108    Physical Exam  Wt Readings from Last 3 Encounters:  06/20/21 208 lb 9.6 oz (94.6 kg)  06/01/21 209 lb 6.4 oz (95 kg)  04/22/21 197 lb 12.8 oz (89.7 kg)    Ht _0  (1.651 m)    BMI 34.71 kg/m   Assessment and Plan:

## 2021-08-26 ENCOUNTER — Ambulatory Visit: Payer: BC Managed Care – PPO | Admitting: Internal Medicine

## 2021-08-29 ENCOUNTER — Encounter: Payer: Self-pay | Admitting: Internal Medicine

## 2021-08-29 ENCOUNTER — Ambulatory Visit: Payer: BC Managed Care – PPO

## 2021-08-30 ENCOUNTER — Ambulatory Visit: Payer: BC Managed Care – PPO | Admitting: Internal Medicine

## 2021-08-30 ENCOUNTER — Other Ambulatory Visit: Payer: Self-pay

## 2021-08-30 ENCOUNTER — Encounter: Payer: Self-pay | Admitting: Internal Medicine

## 2021-08-30 VITALS — BP 112/70 | HR 107 | Temp 98.7°F | Ht 65.0 in | Wt 207.8 lb

## 2021-08-30 DIAGNOSIS — J0100 Acute maxillary sinusitis, unspecified: Secondary | ICD-10-CM

## 2021-08-30 MED ORDER — PREDNISONE 10 MG PO TABS: 10.0000 mg | ORAL_TABLET | ORAL | 0 refills | Status: AC

## 2021-08-30 MED ORDER — AMOXICILLIN-POT CLAVULANATE 875-125 MG PO TABS: 1.0000 | ORAL_TABLET | Freq: Two times a day (BID) | ORAL | 0 refills | Status: AC

## 2021-08-30 NOTE — Progress Notes (Signed)
Date:  08/30/2021   Name:  Amanda Bautista   DOB:  [birth year]   MRN:  [id number]   Chief Complaint: Sinusitis  Sinusitis This is a new problem. Episode onset: 2 days ago. The problem has been gradually worsening since onset. There has been no fever. The pain is moderate. Associated symptoms include congestion, coughing, ear pain, headaches, sinus pressure and a sore throat. Pertinent negatives include no chills or shortness of breath. Negative Covid test Yesterday at home.  Lab Results  Component Value Date   NA 141 10/12/2020   K 4.5 10/12/2020   CO2 23 10/12/2020   GLUCOSE 108 (H) 10/12/2020   BUN 12 10/12/2020   CREATININE 0.68 10/12/2020   CALCIUM 9.8 10/12/2020   EGFR 102 10/12/2020   GFRNONAA 91 12/30/2019   Lab Results  Component Value Date   CHOL 185 10/12/2020   HDL 48 10/12/2020   LDLCALC 106 (H) 10/12/2020   TRIG 177 (H) 10/12/2020   CHOLHDL 3.9 10/12/2020   Lab Results  Component Value Date   TSH 1.550 10/12/2020   Lab Results  Component Value Date   HGBA1C 6.0 (A) 04/22/2021   Lab Results  Component Value Date   WBC 6.6 10/12/2020   HGB 14.4 10/12/2020   HCT 44.6 10/12/2020   MCV 91 10/12/2020   PLT 308 10/12/2020   Lab Results  Component Value Date   ALT 34 (H) 10/12/2020   AST 19 10/12/2020   ALKPHOS 95 10/12/2020   BILITOT 0.4 10/12/2020   No results found for: 25OHVITD2, 25OHVITD3, VD25OH   Review of Systems  Constitutional:  Positive for fatigue. Negative for chills and fever.  HENT:  Positive for congestion, ear pain, sinus pressure and sore throat.   Respiratory:  Positive for cough. Negative for chest tightness, shortness of breath and wheezing.   Cardiovascular:  Negative for chest pain.  Neurological:  Positive for headaches.   Patient Active Problem List   Diagnosis Date Noted   Sprain of ulnar collateral ligament of metacarpophalangeal (MCP) joint of left thumb 02/22/2021   S/P TAH-BSO 06/27/2020   Xerosis of skin 11/11/2018    Hypertensive retinopathy 05/06/2018   Type II diabetes mellitus with complication ([place]) 16/55/3748   Benign essential HTN 04/15/2018   Hyperlipidemia associated with type 2 diabetes mellitus ([place]) 04/15/2018   Depression, major, recurrent, in partial remission ([place]) 04/15/2018   Generalized anxiety disorder 04/15/2018   Mild intermittent asthma, uncomplicated 27/12/8673    No Known Allergies  Past Surgical History:  Procedure Laterality Date   ABDOMINAL HYSTERECTOMY     OOPHORECTOMY     one ovary left    Social History   Tobacco Use   Smoking status: Former    Types: Cigarettes    Quit date: 10/18/2005    Years since quitting: 15.8   Smokeless tobacco: Never  Vaping Use   Vaping Use: Never used  Substance Use Topics   Alcohol use: Never   Drug use: Never     Medication list has been reviewed and updated.  Current Meds  Medication Sig   albuterol (VENTOLIN HFA) 108 (90 Base) MCG/ACT inhaler INHALE 1 TO 2 PUFFS INTO THE LUNGS EVERY 6 HOURS AS NEEDED FOR WHEEZING OR SHORTNESS OF BREATH   ALPRAZolam (XANAX) 0.25 MG tablet Take 1 tablet (0.25 mg total) by mouth 2 (two) times daily as needed for anxiety.   buPROPion (WELLBUTRIN XL) 300 MG 24 hr tablet Take 1 tablet (300 mg total) by mouth  daily.   busPIRone (BUSPAR) 10 MG tablet Take 1 tablet (10 mg total) by mouth 2 (two) times daily.   escitalopram (LEXAPRO) 20 MG tablet Take 1 tablet (20 mg total) by mouth daily.   fluticasone-salmeterol (ADVAIR DISKUS) 250-50 MCG/ACT AEPB Inhale 1 puff into the lungs in the morning and at bedtime.   lisinopril (ZESTRIL) 10 MG tablet Take 1 tablet (10 mg total) by mouth daily.   meloxicam (MOBIC) 15 MG tablet Take 15 mg by mouth daily.   metFORMIN (GLUCOPHAGE-XR) 500 MG 24 hr tablet Take 1 tablet (500 mg total) by mouth daily with breakfast.   montelukast (SINGULAIR) 10 MG tablet Take 1 tablet (10 mg total) by mouth at bedtime.   promethazine-dextromethorphan (PROMETHAZINE-DM) 6.25-15  MG/5ML syrup Take 5 mLs by mouth 4 (four) times daily as needed for cough.   rosuvastatin (CRESTOR) 10 MG tablet Take 1 tablet (10 mg total) by mouth daily.   triamcinolone cream (KENALOG) 0.1 % Apply 1 application topically 2 (two) times daily.    PHQ 2/9 Scores 08/30/2021 08/01/2021 06/20/2021 06/01/2021  PHQ - 2 Score 0 0 6 0  PHQ- 9 Score _0 0  Exception Documentation - - - -    GAD 7 : Generalized Anxiety Score 08/30/2021 08/01/2021 06/20/2021 06/01/2021  Nervous, Anxious, on Edge 1 0 3 1  Control/stop worrying 0 0 3 1  Worry too much - different things 0 0 3 1  Trouble relaxing 0 0 3 1  Restless 0 0 3 0  Easily annoyed or irritable 1 0 3 1  Afraid - awful might happen 0 0 3 0  Total GAD 7 Score 2 0 21 5  Anxiety Difficulty Somewhat difficult - Extremely difficult Not difficult at all    BP Readings from Last 3 Encounters:  08/30/21 112/70  08/01/21 (!) 124/98  06/20/21 (!) 144/70    Physical Exam Constitutional:      Appearance: She is well-developed.  HENT:     Right Ear: Ear canal and external ear normal. No middle ear effusion. Tympanic membrane is erythematous and retracted.     Left Ear: Ear canal and external ear normal.  No middle ear effusion. Tympanic membrane is retracted. Tympanic membrane is not erythematous.     Nose:     Right Sinus: Maxillary sinus tenderness and frontal sinus tenderness present.     Left Sinus: Maxillary sinus tenderness and frontal sinus tenderness present.     Mouth/Throat:     Mouth: No oral lesions.     Pharynx: Uvula midline. Posterior oropharyngeal erythema present. No oropharyngeal exudate.  Cardiovascular:     Rate and Rhythm: Normal rate and regular rhythm.     Heart sounds: Normal heart sounds.  Pulmonary:     Breath sounds: Normal breath sounds. No wheezing or rales.  Lymphadenopathy:     Cervical: No cervical adenopathy.  Neurological:     Mental Status: She is alert and oriented to person, place, and time.    Wt  Readings from Last 3 Encounters:  08/30/21 207 lb 12.8 oz (94.3 kg)  08/01/21 210 lb (95.3 kg)  06/20/21 208 lb 9.6 oz (94.6 kg)    BP 112/70    Pulse (!) 107    Temp 98.7 F (37.1 C) (Oral)    Ht 5' 5" (1.651 m)    Wt 207 lb 12.8 oz (94.3 kg)    SpO2 95%    BMI 34.58 kg/m   Assessment and Plan: 1. Acute  non-recurrent maxillary sinusitis Continue Flonase and Claritin Add Coricidin HBP - amoxicillin-clavulanate (AUGMENTIN) 875-125 MG tablet; Take 1 tablet by mouth 2 (two) times daily for 10 days.  Dispense: 20 tablet; Refill: 0 - predniSONE (DELTASONE) 10 MG tablet; Take 1 tablet (10 mg total) by mouth as directed for 6 days. Take 6,5,4,3,2,1 then stop  Dispense: 21 tablet; Refill: 0   Partially dictated using Editor, commissioning. Any errors are unintentional.  Halina Maidens, MD [facility]  08/30/2021

## 2021-08-30 NOTE — Patient Instructions (Signed)
Coricidin HBP - take as directed 

## 2021-08-31 ENCOUNTER — Other Ambulatory Visit: Payer: Self-pay | Admitting: Internal Medicine

## 2021-08-31 DIAGNOSIS — Z1231 Encounter for screening mammogram for malignant neoplasm of breast: Secondary | ICD-10-CM

## 2021-09-01 ENCOUNTER — Other Ambulatory Visit: Payer: Self-pay | Admitting: Internal Medicine

## 2021-09-01 DIAGNOSIS — J40 Bronchitis, not specified as acute or chronic: Secondary | ICD-10-CM

## 2021-09-01 NOTE — Telephone Encounter (Signed)
Sent via Interface, requested early ? ?Requested Prescriptions  ?Pending Prescriptions Disp Refills  ?? promethazine-dextromethorphan (PROMETHAZINE-DM) 6.25-15 MG/5ML syrup [Pharmacy Med Name: PROMETHAZINE DM SYRUP] 180 mL 0  ?  Sig: TAKE 5 ML BY MOUTH FOUR TIMES DAILY AS NEEDED FOR COUGH  ?  ? Ear, Nose, and Throat:  Antitussives/Expectorants Passed - 09/01/2021  9:28 AM  ?  ?  Passed - Valid encounter within last 12 months  ?  Recent Outpatient Visits   ?      ? 2 days ago Acute non-recurrent maxillary sinusitis  ? [facility] Reubin Milan, MD  ? 1 month ago Bronchitis  ? [facility] Reubin Milan, MD  ? 2 months ago Mild intermittent asthma, uncomplicated  ? [facility] Reubin Milan, MD  ? 3 months ago Acute non-recurrent maxillary sinusitis  ? [facility] Reubin Milan, MD  ? 4 months ago Type II diabetes mellitus with complication [facility])  ? [facility] Reubin Milan, MD  ?  ?  ?Future Appointments   ?        ? In 1 month Judithann Graves Nyoka Cowden, MD [facility]  ?  ? ?  ?  ?  ? ? ?

## 2021-09-05 ENCOUNTER — Telehealth: Payer: BC Managed Care – PPO | Admitting: Internal Medicine

## 2021-09-05 ENCOUNTER — Encounter: Payer: Self-pay | Admitting: Internal Medicine

## 2021-09-05 VITALS — Ht 65.0 in | Wt 207.8 lb

## 2021-09-05 NOTE — Progress Notes (Deleted)
? ? ?Date:  09/05/2021  ? ?Name:  Amanda Bautista   DOB:  Feb 22, 1966   MRN:  [id number] ? ?This encounter was conducted via video encounter due to the need for social distancing in light of the Covid-19 pandemic.  The patient was correctly identified.  I advised that I am conducting the visit from a secure room in my office at [facility] clinic.  The patient is located at home. The limitations of this form of encounter were discussed with the patient and he/she agreed to proceed.  Some vital signs will be absent. ? ?Chief Complaint: Covid Positive ? ?URI  ?This is a new problem. The current episode started yesterday (tested positive for Covid after work exposure.). The problem has been gradually worsening. Treatments tried: treated with antibiotics last week for sinus infection.  ? ?Lab Results  ?Component Value Date  ? NA 141 10/12/2020  ? K 4.5 10/12/2020  ? CO2 23 10/12/2020  ? GLUCOSE 108 (H) 10/12/2020  ? BUN 12 10/12/2020  ? CREATININE 0.68 10/12/2020  ? CALCIUM 9.8 10/12/2020  ? EGFR 102 10/12/2020  ? GFRNONAA 91 12/30/2019  ? ?Lab Results  ?Component Value Date  ? CHOL 185 10/12/2020  ? HDL 48 10/12/2020  ? LDLCALC 106 (H) 10/12/2020  ? TRIG 177 (H) 10/12/2020  ? CHOLHDL 3.9 10/12/2020  ? ?Lab Results  ?Component Value Date  ? TSH 1.550 10/12/2020  ? ?Lab Results  ?Component Value Date  ? HGBA1C 6.0 (A) 04/22/2021  ? ?Lab Results  ?Component Value Date  ? WBC 6.6 10/12/2020  ? HGB 14.4 10/12/2020  ? HCT 44.6 10/12/2020  ? MCV 91 10/12/2020  ? PLT 308 10/12/2020  ? ?Lab Results  ?Component Value Date  ? ALT 34 (H) 10/12/2020  ? AST 19 10/12/2020  ? ALKPHOS 95 10/12/2020  ? BILITOT 0.4 10/12/2020  ? ?No results found for: 25OHVITD2, [place], VD25OH  ? ?Review of Systems ? ?Patient Active Problem List  ? Diagnosis Date Noted  ? Sprain of ulnar collateral ligament of metacarpophalangeal (MCP) joint of left thumb 02/22/2021  ? S/P TAH-BSO 06/27/2020  ? Xerosis of skin 11/11/2018  ? Hypertensive retinopathy  05/06/2018  ? Type II diabetes mellitus with complication ([place]) 13/24/4010  ? Benign essential HTN 04/15/2018  ? Hyperlipidemia associated with type 2 diabetes mellitus ([place]) 04/15/2018  ? Depression, major, recurrent, in partial remission ([place]) 04/15/2018  ? Generalized anxiety disorder 04/15/2018  ? Mild intermittent asthma, uncomplicated 27/25/3664  ? ? ?No Known Allergies ? ?Past Surgical History:  ?Procedure Laterality Date  ? ABDOMINAL HYSTERECTOMY    ? OOPHORECTOMY    ? one ovary left  ? ? ?Social History  ? ?Tobacco Use  ? Smoking status: Former  ?  Types: Cigarettes  ?  Quit date: 10/18/2005  ?  Years since quitting: 15.8  ? Smokeless tobacco: Never  ?Vaping Use  ? Vaping Use: Never used  ?Substance Use Topics  ? Alcohol use: Never  ? Drug use: Never  ? ? ? ?Medication list has been reviewed and updated. ? ?No outpatient medications have been marked as taking for the 09/05/21 encounter (Video Visit) with Glean Hess, MD.  ? ? ?PHQ 2/9 Scores 08/30/2021 08/01/2021 06/20/2021 06/01/2021  ?PHQ - 2 Score 0 0 6 0  ?PHQ- 9 Score _0 0  ?Exception Documentation - - - -  ? ? ?GAD 7 : Generalized Anxiety Score 08/30/2021 08/01/2021 06/20/2021 06/01/2021  ?Nervous, Anxious, on Edge 1 0 3  1  ?Control/stop worrying 0 0 3 1  ?Worry too much - different things 0 0 3 1  ?Trouble relaxing 0 0 3 1  ?Restless 0 0 3 0  ?Easily annoyed or irritable 1 0 3 1  ?Afraid - awful might happen 0 0 3 0  ?Total GAD 7 Score 2 0 21 5  ?Anxiety Difficulty Somewhat difficult - Extremely difficult Not difficult at all  ? ? ?BP Readings from Last 3 Encounters:  ?08/30/21 112/70  ?08/01/21 (!) 124/98  ?06/20/21 (!) 144/70  ? ? ?Physical Exam ? ?Wt Readings from Last 3 Encounters:  ?09/05/21 207 lb 12.8 oz (94.3 kg)  ?08/30/21 207 lb 12.8 oz (94.3 kg)  ?08/01/21 210 lb (95.3 kg)  ? ? ?Ht _0  (1.651 m)   Wt 207 lb 12.8 oz (94.3 kg)   BMI 34.58 kg/m?  ? ?Assessment and Plan: ? ? ? ? ?

## 2021-09-06 NOTE — Progress Notes (Signed)
No show

## 2021-09-26 ENCOUNTER — Encounter: Payer: Self-pay | Admitting: Internal Medicine

## 2021-09-27 ENCOUNTER — Other Ambulatory Visit: Payer: Self-pay | Admitting: Internal Medicine

## 2021-09-27 DIAGNOSIS — J4520 Mild intermittent asthma, uncomplicated: Secondary | ICD-10-CM

## 2021-09-27 MED ORDER — FLUTICASONE-SALMETEROL 250-50 MCG/ACT IN AEPB: 1.0000 | INHALATION_SPRAY | Freq: Two times a day (BID) | RESPIRATORY_TRACT | 5 refills | Status: DC

## 2021-09-29 ENCOUNTER — Ambulatory Visit: Payer: BC Managed Care – PPO | Admitting: Internal Medicine

## 2021-10-02 ENCOUNTER — Encounter: Payer: Self-pay | Admitting: Internal Medicine

## 2021-10-02 ENCOUNTER — Other Ambulatory Visit: Payer: Self-pay | Admitting: Internal Medicine

## 2021-10-02 DIAGNOSIS — J40 Bronchitis, not specified as acute or chronic: Secondary | ICD-10-CM

## 2021-10-02 MED ORDER — PROMETHAZINE-DM 6.25-15 MG/5ML PO SYRP: 5.0000 mL | ORAL_SOLUTION | Freq: Four times a day (QID) | ORAL | 0 refills | Status: DC | PRN

## 2021-10-04 ENCOUNTER — Telehealth: Payer: Self-pay

## 2021-10-04 NOTE — Telephone Encounter (Signed)
Called pt left VM as a reminder to call and schedule mammogram. [phone] ° °KP °

## 2021-10-07 ENCOUNTER — Other Ambulatory Visit: Payer: Self-pay

## 2021-10-07 ENCOUNTER — Other Ambulatory Visit: Payer: Self-pay | Admitting: Internal Medicine

## 2021-10-07 DIAGNOSIS — J4520 Mild intermittent asthma, uncomplicated: Secondary | ICD-10-CM

## 2021-10-07 DIAGNOSIS — I10 Essential (primary) hypertension: Secondary | ICD-10-CM

## 2021-10-07 DIAGNOSIS — F3341 Major depressive disorder, recurrent, in partial remission: Secondary | ICD-10-CM

## 2021-10-07 DIAGNOSIS — F411 Generalized anxiety disorder: Secondary | ICD-10-CM

## 2021-10-07 DIAGNOSIS — E1169 Type 2 diabetes mellitus with other specified complication: Secondary | ICD-10-CM

## 2021-10-07 DIAGNOSIS — E118 Type 2 diabetes mellitus with unspecified complications: Secondary | ICD-10-CM

## 2021-10-07 MED ORDER — ESCITALOPRAM OXALATE 20 MG PO TABS: 20.0000 mg | ORAL_TABLET | Freq: Every day | ORAL | 1 refills | Status: DC

## 2021-10-07 MED ORDER — BUSPIRONE HCL 10 MG PO TABS: 10.0000 mg | ORAL_TABLET | Freq: Two times a day (BID) | ORAL | 1 refills | Status: DC

## 2021-10-07 MED ORDER — LISINOPRIL 10 MG PO TABS: 10.0000 mg | ORAL_TABLET | Freq: Every day | ORAL | 1 refills | Status: DC

## 2021-10-07 MED ORDER — ROSUVASTATIN CALCIUM 10 MG PO TABS: 10.0000 mg | ORAL_TABLET | Freq: Every day | ORAL | 1 refills | Status: DC

## 2021-10-07 MED ORDER — METFORMIN HCL ER 500 MG PO TB24: 500.0000 mg | ORAL_TABLET | Freq: Every day | ORAL | 1 refills | Status: DC

## 2021-10-07 MED ORDER — MONTELUKAST SODIUM 10 MG PO TABS: 10.0000 mg | ORAL_TABLET | Freq: Every day | ORAL | 1 refills | Status: DC

## 2021-10-07 MED ORDER — BUPROPION HCL ER (XL) 300 MG PO TB24: 300.0000 mg | ORAL_TABLET | Freq: Every day | ORAL | 1 refills | Status: DC

## 2021-10-10 NOTE — Telephone Encounter (Signed)
Requested medication (s) are due for refill today:   Provider to review non delegated.  Other 2 received at [facility] On 10/07/2021 at 11:50 AM. ? ?Requested medication (s) are on the active medication list:   Yes for all 3 ? ?Future visit scheduled:   Yes in 4 days 4/14  ? ? ?Last ordered: Xanax 04/22/2021 #60, 2 refills;  Lexapro 10/07/2021 #90, 1 refill;   Buspar 10/07/2021 180, 1 refill  ? ?Returned due to non delegated refill.   ? ?Requested Prescriptions  ?Pending Prescriptions Disp Refills  ? ALPRAZolam (XANAX) 0.25 MG tablet [Pharmacy Med Name: ALPRAZOLAM 0.25MG  TABLETS] 60 tablet   ?  Sig: TAKE 1 TABLET(0.25 MG) BY MOUTH TWICE DAILY AS NEEDED FOR ANXIETY  ?  ? Not Delegated - Psychiatry: Anxiolytics/Hypnotics 2 Failed - 10/07/2021 11:37 AM  ?  ?  Failed - This refill cannot be delegated  ?  ?  Failed - Urine Drug Screen completed in last 360 days  ?  ?  Passed - Patient is not pregnant  ?  ?  Passed - Valid encounter within last 6 months  ?  Recent Outpatient Visits   ? ?      ? 1 month ago No-show for appointment  ? [facility] Reubin Milan, MD  ? 1 month ago Acute non-recurrent maxillary sinusitis  ? [facility] Reubin Milan, MD  ? 2 months ago Bronchitis  ? [facility] Reubin Milan, MD  ? 3 months ago Mild intermittent asthma, uncomplicated  ? [facility] Reubin Milan, MD  ? 4 months ago Acute non-recurrent maxillary sinusitis  ? [facility] Reubin Milan, MD  ? ?  ?  ?Future Appointments   ? ?        ? In 4 days Reubin Milan, MD [facility]  ? ?  ? ?  ?  ?  ? escitalopram (LEXAPRO) 20 MG tablet [Pharmacy Med Name: ESCITALOPRAM 20MG  TABLETS] 90 tablet 1  ?  Sig: TAKE 1 TABLET(20 MG) BY MOUTH DAILY  ?  ? Psychiatry:  Antidepressants - SSRI Passed - 10/07/2021 11:37 AM  ?  ?  Passed - Completed PHQ-2 or PHQ-9 in the last 360 days  ?  ?  Passed - Valid encounter within last 6 months  ?  Recent Outpatient Visits   ? ?      ?  1 month ago No-show for appointment  ? [facility], MD  ? 1 month ago Acute non-recurrent maxillary sinusitis  ? [facility], MD  ? 2 months ago Bronchitis  ? [facility], MD  ? 3 months ago Mild intermittent asthma, uncomplicated  ? [facility], MD  ? 4 months ago Acute non-recurrent maxillary sinusitis  ? [facility], MD  ? ?  ?  ?Future Appointments   ? ?        ? In 4 days Reubin Milan, MD [facility]  ? ?  ? ?  ?  ?  ? busPIRone (BUSPAR) 10 MG tablet [Pharmacy Med Name: BUSPIRONE 10MG  TABLETS] 180 tablet 1  ?  Sig: TAKE 1 TABLET(10 MG) BY MOUTH TWICE DAILY  ?  ? Psychiatry: Anxiolytics/Hypnotics - Non-controlled Passed - 10/07/2021 11:37 AM  ?  ?  Passed - Valid encounter within last 12 months  ?  Recent Outpatient Visits   ? ?      ? 1 month ago No-show for appointment  ? [facility] Reubin Milan, MD  ? 1 month ago Acute non-recurrent maxillary sinusitis  ? [facility] Reubin Milan, MD  ? 2 months ago Bronchitis  ? [facility] Reubin Milan, MD  ? 3 months ago Mild intermittent asthma, uncomplicated  ? [facility] Reubin Milan, MD  ? 4 months ago Acute non-recurrent maxillary sinusitis  ? [facility] Reubin Milan, MD  ? ?  ?  ?Future Appointments   ? ?        ? In 4 days Reubin Milan, MD [facility]  ? ?  ? ?  ?  ?  ? ?

## 2021-10-14 ENCOUNTER — Encounter: Payer: BC Managed Care – PPO | Admitting: Internal Medicine

## 2021-10-14 NOTE — Progress Notes (Deleted)
Date:  10/14/2021   Name:  Amanda Bautista   DOB:  1966/02/12   MRN:  [id number]   Chief Complaint: No chief complaint on file. Amanda Bautista is a 56 y.o. female who presents today for her Complete Annual Exam. She feels {DESC; WELL/FAIRLY WELL/POORLY:18703}. She reports exercising ***. She reports she is sleeping {DESC; WELL/FAIRLY WELL/POORLY:18703}. Breast complaints ***.  Mammogram: scheduled DEXA: none Pap smear: discontinued Colonoscopy: 07/2011 due  Health Maintenance Due  Topic Date Due   HIV Screening  Never done   Zoster Vaccines- Shingrix (1 of 2) Never done   COVID-19 Vaccine (5 - Booster for Moderna series) 04/14/2021   COLONOSCOPY (Pts 45-83yrs Insurance coverage will need to be confirmed)  07/04/2021   MAMMOGRAM  10/14/2021   OPHTHALMOLOGY EXAM  10/11/2021   FOOT EXAM  10/12/2021    Immunization History  Administered Date(s) Administered   Influenza, Seasonal, Injecte, Preservative Fre 03/24/2008   Influenza,inj,Quad PF,6+ Mos 04/24/2018, 03/05/2019, 04/09/2020, 04/22/2021   Influenza-Unspecified 04/04/2016   MMR 02/01/2016   Moderna Sars-Covid-2 Vaccination 08/27/2019, 09/26/2019, 04/22/2020, 02/17/2021   PPD Test 01/20/2019   Pneumococcal Conjugate-13 07/09/2015   Pneumococcal Polysaccharide-23 05/01/2019   Pneumococcal-Unspecified 07/09/2015   Tdap 07/09/2015    Hypertension This is a chronic problem. The problem is controlled. Pertinent negatives include no chest pain, headaches, palpitations or shortness of breath. Past treatments include ACE inhibitors. The current treatment provides significant improvement.  Diabetes She presents for her follow-up diabetic visit. She has type 2 diabetes mellitus. Her disease course has been stable. Pertinent negatives for hypoglycemia include no dizziness, headaches, nervousness/anxiousness or tremors. Pertinent negatives for diabetes include no chest pain, no fatigue, no polydipsia and no polyuria. Current diabetic  treatment includes oral agent (monotherapy) (metformine). An ACE inhibitor/angiotensin II receptor blocker is being taken. Eye exam is not current.  Hyperlipidemia This is a chronic problem. The problem is controlled. Pertinent negatives include no chest pain or shortness of breath. Current antihyperlipidemic treatment includes statins.  Depression        This is a chronic problem.The problem is unchanged.  Associated symptoms include no fatigue and no headaches.  Past treatments include SSRIs - Selective serotonin reuptake inhibitors and other medications.  Compliance with treatment is good.  Lab Results  Component Value Date   NA 141 10/12/2020   K 4.5 10/12/2020   CO2 23 10/12/2020   GLUCOSE 108 (H) 10/12/2020   BUN 12 10/12/2020   CREATININE 0.68 10/12/2020   CALCIUM 9.8 10/12/2020   EGFR 102 10/12/2020   GFRNONAA 91 12/30/2019   Lab Results  Component Value Date   CHOL 185 10/12/2020   HDL 48 10/12/2020   LDLCALC 106 (H) 10/12/2020   TRIG 177 (H) 10/12/2020   CHOLHDL 3.9 10/12/2020   Lab Results  Component Value Date   TSH 1.550 10/12/2020   Lab Results  Component Value Date   HGBA1C 6.0 (A) 04/22/2021   Lab Results  Component Value Date   WBC 6.6 10/12/2020   HGB 14.4 10/12/2020   HCT 44.6 10/12/2020   MCV 91 10/12/2020   PLT 308 10/12/2020   Lab Results  Component Value Date   ALT 34 (H) 10/12/2020   AST 19 10/12/2020   ALKPHOS 95 10/12/2020   BILITOT 0.4 10/12/2020   No results found for: 25OHVITD2, 25OHVITD3, VD25OH   Review of Systems  Constitutional:  Negative for chills, fatigue and fever.  HENT:  Negative for congestion, hearing loss, tinnitus, trouble swallowing and voice change.  Eyes:  Negative for visual disturbance.  Respiratory:  Negative for cough, chest tightness, shortness of breath and wheezing.   Cardiovascular:  Negative for chest pain, palpitations and leg swelling.  Gastrointestinal:  Negative for abdominal pain, constipation,  diarrhea and vomiting.  Endocrine: Negative for polydipsia and polyuria.  Genitourinary:  Negative for dysuria, frequency, genital sores, vaginal bleeding and vaginal discharge.  Musculoskeletal:  Negative for arthralgias, gait problem and joint swelling.  Skin:  Negative for color change and rash.  Neurological:  Negative for dizziness, tremors, light-headedness and headaches.  Hematological:  Negative for adenopathy. Does not bruise/bleed easily.  Psychiatric/Behavioral:  Positive for depression. Negative for dysphoric mood and sleep disturbance. The patient is not nervous/anxious.    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  No outpatient medications have been marked as taking for the 10/14/21 encounter (Appointment) with Reubin Milan, MD.       08/30/2021    8:06 AM 08/01/2021    9:34 AM 06/20/2021   10:43 AM 06/01/2021   10:22 AM  GAD 7 : Generalized Anxiety Score  Nervous, Anxious,  on Edge 1 0 3 1  Control/stop worrying 0 0 3 1  Worry too much - different things 0 0 3 1  Trouble relaxing 0 0 3 1  Restless 0 0 3 0  Easily annoyed or irritable 1 0 3 1  Afraid - awful might happen 0 0 3 0  Total GAD 7 Score 2 0 21 5  Anxiety Difficulty Somewhat difficult  Extremely difficult Not difficult at all       08/30/2021    8:06 AM  Depression screen PHQ 2/9  Decreased Interest 0  Down, Depressed, Hopeless 0  PHQ - 2 Score 0  Altered sleeping 1  Tired, decreased energy 1  Change in appetite 1  Feeling bad or failure about yourself  0  Trouble concentrating 0  Moving slowly or fidgety/restless 0  Suicidal thoughts 0  PHQ-9 Score 3  Difficult doing work/chores Not difficult at all    BP Readings from Last 3 Encounters:  08/30/21 112/70  08/01/21 (!) 124/98  06/20/21 (!) 144/70    Physical Exam Vitals and nursing note reviewed.  Constitutional:      General: She is not in acute distress.    Appearance: She is well-developed.  HENT:     Head: Normocephalic and atraumatic.     Right Ear:  Tympanic membrane and ear canal normal.     Left Ear: Tympanic membrane and ear canal normal.     Nose:     Right Sinus: No maxillary sinus tenderness.     Left Sinus: No maxillary sinus tenderness.  Eyes:     General: No scleral icterus.       Right eye: No discharge.        Left eye: No discharge.     Conjunctiva/sclera: Conjunctivae normal.  Neck:     Thyroid: No thyromegaly.     Vascular: No carotid bruit.  Cardiovascular:     Rate and Rhythm: Normal rate and regular rhythm.     Pulses: Normal pulses.     Heart sounds: Normal heart sounds.  Pulmonary:     Effort: Pulmonary effort is normal. No respiratory distress.     Breath sounds: No wheezing.  Chest:  Breasts:    Right: No mass, nipple discharge, skin change or tenderness.     Left: No mass, nipple discharge, skin change or tenderness.  Abdominal:     General: Bowel sounds are normal.     Palpations:  Abdomen is soft.     Tenderness: There is no abdominal tenderness.  Musculoskeletal:     Cervical back: Normal range of motion. No erythema.     Right lower leg: No edema.     Left lower leg: No edema.  Lymphadenopathy:     Cervical: No cervical adenopathy.  Skin:    General: Skin is warm and dry.     Findings: No rash.  Neurological:     Mental Status: She is alert and oriented to person, place, and time.     Cranial Nerves: No cranial nerve deficit.     Sensory: No sensory deficit.     Deep Tendon Reflexes: Reflexes are normal and symmetric.  Psychiatric:        Attention and Perception: Attention normal.        Mood and Affect: Mood normal.    Wt Readings from Last 3 Encounters:  09/05/21 207 lb 12.8 oz (94.3 kg)  08/30/21 207 lb 12.8 oz (94.3 kg)  08/01/21 210 lb (95.3 kg)    There were no vitals taken for this visit.  Assessment and Plan:

## 2021-10-15 ENCOUNTER — Other Ambulatory Visit: Payer: Self-pay | Admitting: Internal Medicine

## 2021-10-15 DIAGNOSIS — L853 Xerosis cutis: Secondary | ICD-10-CM

## 2021-10-16 MED ORDER — TRIAMCINOLONE ACETONIDE 0.1 % EX CREA: 1.0000 "application " | TOPICAL_CREAM | Freq: Two times a day (BID) | CUTANEOUS | 0 refills | Status: AC

## 2021-10-19 ENCOUNTER — Ambulatory Visit: Payer: BC Managed Care – PPO | Admitting: Internal Medicine

## 2021-10-21 ENCOUNTER — Other Ambulatory Visit: Payer: Self-pay | Admitting: Internal Medicine

## 2021-10-21 DIAGNOSIS — J4520 Mild intermittent asthma, uncomplicated: Secondary | ICD-10-CM

## 2021-10-21 NOTE — Telephone Encounter (Signed)
Duplicate as well as has new rx at same pharm ?Requested Prescriptions  ?Pending Prescriptions Disp Refills  ?? montelukast (SINGULAIR) 10 MG tablet [Pharmacy Med Name: MONTELUKAST 10MG  TABLETS] 30 tablet   ?  Sig: TAKE 1 TABLET(10 MG) BY MOUTH AT BEDTIME  ?  ? Pulmonology:  Leukotriene Inhibitors Passed - 10/21/2021  3:32 AM  ?  ?  Passed - Valid encounter within last 12 months  ?  Recent Outpatient Visits   ?      ? 1 month ago No-show for appointment  ? [facility], [place]  ? 1 month ago Acute non-recurrent maxillary sinusitis  ? [facility], MD  ? 2 months ago Bronchitis  ? [facility], MD  ? 4 months ago Mild intermittent asthma, uncomplicated  ? [facility], MD  ? 4 months ago Acute non-recurrent maxillary sinusitis  ? [facility], MD  ?  ?  ?Future Appointments   ?        ? In 1 week Reubin Milan, MD [facility]  ?  ? ?  ?  ?  ? ? ?

## 2021-10-24 ENCOUNTER — Telehealth: Payer: Self-pay

## 2021-10-24 ENCOUNTER — Other Ambulatory Visit (HOSPITAL_BASED_OUTPATIENT_CLINIC_OR_DEPARTMENT_OTHER): Payer: Self-pay | Admitting: Internal Medicine

## 2021-10-24 DIAGNOSIS — Z1231 Encounter for screening mammogram for malignant neoplasm of breast: Secondary | ICD-10-CM

## 2021-10-24 NOTE — Telephone Encounter (Signed)
Copied from CRM [id number]. Topic: General - Inquiry ?>> Oct 24, 2021  9:36 AM Amanda Bautista, Rosey Bath D wrote: ?Reason for CRM: Patient called and would like to know if she can be worked in for a CPE this week due to her insurance will run out. Patient has an appt on 11/01/21 with Dr. Leonard Schwartz. Please call patient back. ?

## 2021-10-24 NOTE — Telephone Encounter (Signed)
Spoke to pt let her know that we did not have any CPE appts available that we could see her for a office visit. She will get labs done at this office visit. Pt verbalized understanding. ? ?KP ?

## 2021-10-25 ENCOUNTER — Ambulatory Visit (HOSPITAL_BASED_OUTPATIENT_CLINIC_OR_DEPARTMENT_OTHER)
Admission: RE | Admit: 2021-10-25 | Discharge: 2021-10-25 | Disposition: A | Discharge status: Home / Self Care | Payer: BC Managed Care – PPO | Source: Ambulatory Visit | Attending: Internal Medicine | Admitting: Internal Medicine

## 2021-10-25 ENCOUNTER — Encounter (HOSPITAL_BASED_OUTPATIENT_CLINIC_OR_DEPARTMENT_OTHER): Payer: Self-pay

## 2021-10-25 DIAGNOSIS — Z1231 Encounter for screening mammogram for malignant neoplasm of breast: Secondary | ICD-10-CM | POA: Diagnosis present

## 2021-10-26 ENCOUNTER — Ambulatory Visit: Payer: BC Managed Care – PPO | Admitting: Internal Medicine

## 2021-10-26 ENCOUNTER — Other Ambulatory Visit: Payer: Self-pay

## 2021-10-26 ENCOUNTER — Other Ambulatory Visit: Payer: Self-pay | Admitting: Internal Medicine

## 2021-10-26 ENCOUNTER — Encounter: Payer: Self-pay | Admitting: Internal Medicine

## 2021-10-26 DIAGNOSIS — E118 Type 2 diabetes mellitus with unspecified complications: Secondary | ICD-10-CM

## 2021-10-26 DIAGNOSIS — E1169 Type 2 diabetes mellitus with other specified complication: Secondary | ICD-10-CM

## 2021-10-26 DIAGNOSIS — J4520 Mild intermittent asthma, uncomplicated: Secondary | ICD-10-CM

## 2021-10-26 DIAGNOSIS — I10 Essential (primary) hypertension: Secondary | ICD-10-CM

## 2021-10-26 DIAGNOSIS — F411 Generalized anxiety disorder: Secondary | ICD-10-CM

## 2021-10-26 MED ORDER — ROSUVASTATIN CALCIUM 10 MG PO TABS: 10.0000 mg | ORAL_TABLET | Freq: Every day | ORAL | 0 refills | Status: DC

## 2021-10-26 MED ORDER — MONTELUKAST SODIUM 10 MG PO TABS: 10.0000 mg | ORAL_TABLET | Freq: Every day | ORAL | 0 refills | Status: DC

## 2021-10-26 MED ORDER — ALPRAZOLAM 0.25 MG PO TABS: 0.2500 mg | ORAL_TABLET | Freq: Two times a day (BID) | ORAL | 0 refills | Status: DC | PRN

## 2021-10-27 LAB — LIPID PANEL
Chol/HDL Ratio: 3.1 ratio (ref 0.0–4.4)
Cholesterol, Total: 170 mg/dL (ref 100–199)
HDL: 54 mg/dL (ref >39)
LDL Chol Calc (NIH): 88 mg/dL (ref 0–99)
Triglycerides: 166 mg/dL — ABNORMAL HIGH (ref 0–149)
VLDL Cholesterol Cal: 28 mg/dL (ref 5–40)

## 2021-10-27 LAB — COMPREHENSIVE METABOLIC PANEL
ALT: 25 IU/L (ref 0–32)
AST: 19 IU/L (ref 0–40)
Albumin/Globulin Ratio: 2.3 — ABNORMAL HIGH (ref 1.2–2.2)
Albumin: 4.6 g/dL (ref 3.8–4.9)
Alkaline Phosphatase: 88 IU/L (ref 44–121)
BUN/Creatinine Ratio: 19 (ref 9–23)
BUN: 13 mg/dL (ref 6–24)
Bilirubin Total: 0.4 mg/dL (ref 0.0–1.2)
CO2: 26 mmol/L (ref 20–29)
Calcium: 9.6 mg/dL (ref 8.7–10.2)
Chloride: 101 mmol/L (ref 96–106)
Creatinine, Ser: 0.7 mg/dL (ref 0.57–1.00)
Globulin, Total: 2 g/dL (ref 1.5–4.5)
Glucose: 111 mg/dL — ABNORMAL HIGH (ref 70–99)
Potassium: 4.7 mmol/L (ref 3.5–5.2)
Sodium: 141 mmol/L (ref 134–144)
Total Protein: 6.6 g/dL (ref 6.0–8.5)
eGFR: 101 mL/min/{1.73_m2} (ref >59)

## 2021-10-27 LAB — CBC WITH DIFFERENTIAL/PLATELET
Basophils Absolute: 0 x10E3/uL (ref 0.0–0.2)
Basos: 0 %
EOS (ABSOLUTE): 0.3 x10E3/uL (ref 0.0–0.4)
Eos: 5 %
Hematocrit: 43.6 % (ref 34.0–46.6)
Hemoglobin: 14.3 g/dL (ref 11.1–15.9)
Immature Grans (Abs): 0 x10E3/uL (ref 0.0–0.1)
Immature Granulocytes: 0 %
Lymphocytes Absolute: 2.1 x10E3/uL (ref 0.7–3.1)
Lymphs: 35 %
MCH: 29.4 pg (ref 26.6–33.0)
MCHC: 32.8 g/dL (ref 31.5–35.7)
MCV: 90 fL (ref 79–97)
Monocytes Absolute: 0.4 x10E3/uL (ref 0.1–0.9)
Monocytes: 7 %
Neutrophils Absolute: 3.2 x10E3/uL (ref 1.4–7.0)
Neutrophils: 53 %
Platelets: 291 x10E3/uL (ref 150–450)
RBC: 4.86 x10E6/uL (ref 3.77–5.28)
RDW: 14 % (ref 11.7–15.4)
WBC: 6 x10E3/uL (ref 3.4–10.8)

## 2021-10-27 LAB — HEMOGLOBIN A1C
Est. average glucose Bld gHb Est-mCnc: 137 mg/dL
Hgb A1c MFr Bld: 6.4 % — ABNORMAL HIGH (ref 4.8–5.6)

## 2021-10-27 LAB — TSH: TSH: 1.4 u[IU]/mL (ref 0.450–4.500)

## 2021-10-27 LAB — MICROALBUMIN / CREATININE URINE RATIO
Creatinine, Urine: 108.6 mg/dL
Microalb/Creat Ratio: 10 mg/g creat (ref 0–29)
Microalbumin, Urine: 11 ug/mL

## 2021-10-27 NOTE — Telephone Encounter (Signed)
Requested medication (s) are due for refill today:   Yes ? ?Requested medication (s) are on the active medication list:   Yes ? ?Future visit scheduled:   No   Pt has had multiple cancellations and No Shows.   Appt for 5/2 already been cancelled ? ? ?Last ordered: 10/26/2021 #90, 0 refills ? ?Returned because pt labs overdue per protocol and cancellations/No Shows.  ? ?Requested Prescriptions  ?Pending Prescriptions Disp Refills  ? rosuvastatin (CRESTOR) 10 MG tablet [Pharmacy Med Name: ROSUVASTATIN 10MG  TABLETS] 90 tablet 1  ?  Sig: TAKE 1 TABLET(10 MG) BY MOUTH DAILY  ?  ? Cardiovascular:  Antilipid - Statins 2 Failed - 10/26/2021 11:31 AM  ?  ?  Failed - Cr in normal range and within 360 days  ?  Creatinine, Ser  ?Date Value Ref Range Status  ?10/12/2020 0.68 0.57 - 1.00 mg/dL Final  ?  ?  ?  ?  Failed - Lipid Panel in normal range within the last 12 months  ?  Cholesterol, Total  ?Date Value Ref Range Status  ?10/12/2020 185 100 - 199 mg/dL Final  ? ?LDL Chol Calc (NIH)  ?Date Value Ref Range Status  ?10/12/2020 106 (H) 0 - 99 mg/dL Final  ? ?HDL  ?Date Value Ref Range Status  ?10/12/2020 48 >39 mg/dL Final  ? ?Triglycerides  ?Date Value Ref Range Status  ?10/12/2020 177 (H) 0 - 149 mg/dL Final  ? ?  ?  ?  Passed - Patient is not pregnant  ?  ?  Passed - Valid encounter within last 12 months  ?  Recent Outpatient Visits   ? ?      ? 1 month ago No-show for appointment  ? [facility], [place]  ? 1 month ago Acute non-recurrent maxillary sinusitis  ? [facility], MD  ? 2 months ago Bronchitis  ? [facility], MD  ? 4 months ago Mild intermittent asthma, uncomplicated  ? [facility], [place]  ? 4 months ago Acute non-recurrent maxillary sinusitis  ? [facility], [place]  ? ?  ?  ? ? ?  ?  ?  ? ?

## 2021-11-01 ENCOUNTER — Encounter: Payer: BC Managed Care – PPO | Admitting: Internal Medicine

## 2021-11-06 ENCOUNTER — Encounter: Payer: Self-pay | Admitting: Internal Medicine

## 2021-11-16 ENCOUNTER — Ambulatory Visit: Payer: BC Managed Care – PPO

## 2022-01-23 ENCOUNTER — Other Ambulatory Visit: Payer: Self-pay | Admitting: Internal Medicine

## 2022-01-23 DIAGNOSIS — J4520 Mild intermittent asthma, uncomplicated: Secondary | ICD-10-CM

## 2022-01-23 DIAGNOSIS — E1169 Type 2 diabetes mellitus with other specified complication: Secondary | ICD-10-CM

## 2022-01-24 NOTE — Telephone Encounter (Signed)
Requested Prescriptions  Pending Prescriptions Disp Refills  . montelukast (SINGULAIR) 10 MG tablet [Pharmacy Med Name: MONTELUKAST 10MG  TABLETS] 90 tablet 0    Sig: TAKE 1 TABLET(10 MG) BY MOUTH AT BEDTIME     Pulmonology:  Leukotriene Inhibitors Passed - 01/23/2022  8:06 AM      Passed - Valid encounter within last 12 months    Recent Outpatient Visits          4 months ago No-show for appointment   [facility], [place]   4 months ago Acute non-recurrent maxillary sinusitis   [facility], [place]   5 months ago Bronchitis   [facility], [place]   7 months ago Mild intermittent asthma, uncomplicated   [facility], MD   7 months ago Acute non-recurrent maxillary sinusitis   [facility], MD             . rosuvastatin (CRESTOR) 10 MG tablet [Pharmacy Med Name: ROSUVASTATIN 10MG  TABLETS] 90 tablet 0    Sig: TAKE 1 TABLET(10 MG) BY MOUTH DAILY     Cardiovascular:  Antilipid - Statins 2 Failed - 01/23/2022  8:06 AM      Failed - Lipid Panel in normal range within the last 12 months    Cholesterol, Total  Date Value Ref Range Status  10/26/2021 170 100 - 199 mg/dL Final   LDL Chol Calc (NIH)  Date Value Ref Range Status  10/26/2021 88 0 - 99 mg/dL Final   HDL  Date Value Ref Range Status  10/26/2021 54 >39 mg/dL Final   Triglycerides  Date Value Ref Range Status  10/26/2021 166 (H) 0 - 149 mg/dL Final         Passed - Cr in normal range and within 360 days    Creatinine, Ser  Date Value Ref Range Status  10/26/2021 0.70 0.57 - 1.00 mg/dL Final         Passed - Patient is not pregnant      Passed - Valid encounter within last 12 months    Recent Outpatient Visits          4 months ago No-show for appointment   [facility] 10/28/2021, MD   4 months ago Acute non-recurrent maxillary sinusitis   [facility]  Reubin Milan, MD   5 months ago Bronchitis   [facility] Reubin Milan, MD   7 months ago Mild intermittent asthma, uncomplicated   [facility] Reubin Milan, MD   7 months ago Acute non-recurrent maxillary sinusitis   [facility] Reubin Milan, MD

## 2022-01-30 ENCOUNTER — Encounter: Payer: Self-pay | Admitting: Internal Medicine

## 2022-02-01 ENCOUNTER — Other Ambulatory Visit: Payer: Self-pay | Admitting: Internal Medicine

## 2022-02-01 DIAGNOSIS — E118 Type 2 diabetes mellitus with unspecified complications: Secondary | ICD-10-CM

## 2022-02-02 ENCOUNTER — Other Ambulatory Visit: Payer: Self-pay

## 2022-02-02 ENCOUNTER — Other Ambulatory Visit: Payer: Self-pay | Admitting: Internal Medicine

## 2022-02-02 DIAGNOSIS — I10 Essential (primary) hypertension: Secondary | ICD-10-CM

## 2022-05-02 ENCOUNTER — Other Ambulatory Visit: Payer: Self-pay

## 2022-05-02 ENCOUNTER — Other Ambulatory Visit: Payer: Self-pay | Admitting: Internal Medicine

## 2022-05-02 ENCOUNTER — Encounter: Payer: Self-pay | Admitting: Internal Medicine

## 2022-05-02 DIAGNOSIS — F3341 Major depressive disorder, recurrent, in partial remission: Secondary | ICD-10-CM

## 2022-05-02 DIAGNOSIS — J4520 Mild intermittent asthma, uncomplicated: Secondary | ICD-10-CM

## 2022-05-02 DIAGNOSIS — E118 Type 2 diabetes mellitus with unspecified complications: Secondary | ICD-10-CM

## 2022-05-02 DIAGNOSIS — F411 Generalized anxiety disorder: Secondary | ICD-10-CM

## 2022-05-02 DIAGNOSIS — I10 Essential (primary) hypertension: Secondary | ICD-10-CM

## 2022-05-02 MED ORDER — BUPROPION HCL ER (XL) 300 MG PO TB24: 300.0000 mg | ORAL_TABLET | Freq: Every day | ORAL | 0 refills | Status: DC

## 2022-05-02 MED ORDER — FLUTICASONE-SALMETEROL 250-50 MCG/ACT IN AEPB: 1.0000 | INHALATION_SPRAY | Freq: Two times a day (BID) | RESPIRATORY_TRACT | 2 refills | Status: DC

## 2022-05-02 NOTE — Telephone Encounter (Signed)
Please review.  KP

## 2022-05-03 ENCOUNTER — Ambulatory Visit: Payer: Self-pay | Admitting: *Deleted

## 2022-05-03 NOTE — Telephone Encounter (Signed)
Summary: Possible sinus infection   Pt believes she has a sinus infection, symptomatic and is seeking an appt. No appt soon enough      Reason for Disposition  Earache  Answer Assessment - Initial Assessment Questions 1. LOCATION: "Where does it hurt?"      Sinus pain- eyes, forehead, ears 2. ONSET: "When did the sinus pain start?"  (e.g., hours, days)      Monday 3. SEVERITY: "How bad is the pain?"   (Scale 1-10; mild, moderate or severe)   - MILD (1-3): doesn't interfere with normal activities    - MODERATE (4-7): interferes with normal activities (e.g., work or school) or awakens from sleep   - SEVERE (8-10): excruciating pain and patient unable to do any normal activities        moderate 4. RECURRENT SYMPTOM: "Have you ever had sinus problems before?" If Yes, ask: "When was the last time?" and "What happened that time?"      Yes- 1 year ago- ATB 5. NASAL CONGESTION: "Is the nose blocked?" If Yes, ask: "Can you open it or must you breathe through your mouth?"     On/off- using nasal spray- helps 6. NASAL DISCHARGE: "Do you have discharge from your nose?" If so ask, "What color?"     No- not getting mucus out 7. FEVER: "Do you have a fever?" If Yes, ask: "What is it, how was it measured, and when did it start?"      No 8. OTHER SYMPTOMS: "Do you have any other symptoms?" (e.g., sore throat, cough, earache, difficulty breathing)     Cough, ear pressure  Protocols used: Sinus Pain or Congestion-A-AH

## 2022-05-03 NOTE — Telephone Encounter (Signed)
  Chief Complaint: sinus pain/pressure Symptoms: facial pain, ear pressure,nasal drainage Frequency: started Monday Pertinent Negatives: Patient denies fever Disposition: [] ED /[] Urgent Care (no appt availability in office) / [x] Appointment(In office/virtual)/ []  [place] [facility]/ [] Home Care/ [] Refused Recommended Disposition /[] [place] [facility]/ []  Follow-up with PCP Additional Notes:

## 2022-05-04 ENCOUNTER — Ambulatory Visit (INDEPENDENT_AMBULATORY_CARE_PROVIDER_SITE_OTHER): Payer: BC Managed Care – PPO | Admitting: Internal Medicine

## 2022-05-04 ENCOUNTER — Encounter: Payer: Self-pay | Admitting: Internal Medicine

## 2022-05-04 VITALS — BP 128/72 | HR 87 | Temp 98.2°F | Ht 65.0 in | Wt 197.0 lb

## 2022-05-04 DIAGNOSIS — J4520 Mild intermittent asthma, uncomplicated: Secondary | ICD-10-CM

## 2022-05-04 DIAGNOSIS — I10 Essential (primary) hypertension: Secondary | ICD-10-CM | POA: Diagnosis not present

## 2022-05-04 DIAGNOSIS — J0100 Acute maxillary sinusitis, unspecified: Secondary | ICD-10-CM

## 2022-05-04 DIAGNOSIS — F3341 Major depressive disorder, recurrent, in partial remission: Secondary | ICD-10-CM | POA: Diagnosis not present

## 2022-05-04 DIAGNOSIS — E118 Type 2 diabetes mellitus with unspecified complications: Secondary | ICD-10-CM

## 2022-05-04 MED ORDER — AMOXICILLIN-POT CLAVULANATE 875-125 MG PO TABS: 1.0000 | ORAL_TABLET | Freq: Two times a day (BID) | ORAL | 0 refills | Status: AC

## 2022-05-04 NOTE — Progress Notes (Signed)
Date:  05/04/2022   Name:  Amanda Bautista   DOB:  02/20/1966   MRN:  [id number]   Chief Complaint: Sinusitis  Sinusitis This is a new problem. Episode onset: X4 days. The problem has been gradually worsening since onset. There has been no fever. Her pain is at a severity of 4/10. The pain is mild. Associated symptoms include congestion, coughing, ear pain (and fullness), headaches, shortness of breath, sinus pressure and a sore throat. Pertinent negatives include no diaphoresis.  Diabetes She presents for her follow-up diabetic visit. She has type 2 diabetes mellitus. Her disease course has been stable. Hypoglycemia symptoms include headaches and nervousness/anxiousness. Pertinent negatives for diabetes include no chest pain and no fatigue.  Depression        This is a chronic problem.  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  Associated symptoms include headaches.  Associated symptoms include no fatigue and no appetite change.  Past treatments include SSRIs - Selective serotonin reuptake inhibitors (buspar and bupropion). Hypertension This is a chronic problem. The problem is controlled. Associated symptoms include headaches and shortness of breath. Pertinent negatives include no chest pain. Past treatments include ACE inhibitors.    Lab Results  Component Value Date   NA 141 10/26/2021   K 4.7 10/26/2021   CO2 26 10/26/2021   GLUCOSE 111 (H) 10/26/2021   BUN 13 10/26/2021   CREATININE 0.70 10/26/2021   CALCIUM 9.6 10/26/2021   EGFR 101 10/26/2021   GFRNONAA 91 12/30/2019   Lab Results  Component Value Date   CHOL 170 10/26/2021   HDL 54 10/26/2021   LDLCALC 88 10/26/2021   TRIG 166 (H) 10/26/2021   CHOLHDL 3.1 10/26/2021   Lab Results  Component Value Date   TSH 1.400 10/26/2021   Lab Results  Component Value Date   HGBA1C 6.4 (H) 10/26/2021   Lab Results  Component Value Date   WBC 6.0 10/26/2021    HGB 14.3 10/26/2021   HCT 43.6 10/26/2021   MCV 90 10/26/2021   PLT 291 10/26/2021   Lab Results  Component Value Date   ALT 25 10/26/2021   AST 19 10/26/2021   ALKPHOS 88 10/26/2021   BILITOT 0.4 10/26/2021   No results found for: "25OHVITD2", "25OHVITD3", "VD25OH"   Review of Systems  Constitutional:  Negative for appetite change, diaphoresis, fatigue and fever.  HENT:  Positive for congestion, ear pain (and fullness), sinus pressure and sore throat. Negative for postnasal drip and trouble swallowing.   Respiratory:  Positive for cough, shortness of breath and wheezing. Negative for chest tightness.   Cardiovascular:  Negative for chest pain.  Neurological:  Positive for headaches.  Psychiatric/Behavioral:  Positive for depression. Negative for dysphoric mood and sleep disturbance. The patient is nervous/anxious.     Patient Active Problem List   Diagnosis Date Noted   Sprain of ulnar collateral ligament of metacarpophalangeal (MCP) joint of left thumb 02/22/2021   S/P TAH-BSO 06/27/2020   Xerosis of skin 11/11/2018   Hypertensive retinopathy 05/06/2018   Type II diabetes mellitus with complication ([place]) 37/10/8887   Benign essential HTN 04/15/2018   Hyperlipidemia associated with type 2 diabetes mellitus ([place]) 04/15/2018   Depression, major, recurrent, in partial remission ([place]) 04/15/2018   Generalized anxiety disorder 04/15/2018   Mild intermittent asthma, uncomplicated 16/94/5038    No Known Allergies  Past Surgical History:  Procedure Laterality Date   OOPHORECTOMY  one ovary left   TOTAL ABDOMINAL HYSTERECTOMY      Social History   Tobacco Use   Smoking status: Former    Types: Cigarettes    Quit date: 10/18/2005    Years since quitting: 16.5   Smokeless tobacco: Never  Vaping Use   Vaping Use: Never used  Substance Use Topics   Alcohol use: Never   Drug use: Never     Medication list has been reviewed and updated.  Current Meds   Medication Sig   albuterol (VENTOLIN HFA) 108 (90 Base) MCG/ACT inhaler INHALE 1 TO 2 PUFFS INTO THE LUNGS EVERY 6 HOURS AS NEEDED FOR WHEEZING OR SHORTNESS OF BREATH   ALPRAZolam (XANAX) 0.25 MG tablet TAKE 1 TABLET(0.25 MG) BY MOUTH TWICE DAILY AS NEEDED FOR ANXIETY   APPLE CIDER VINEGAR PO Take by mouth.   buPROPion (WELLBUTRIN XL) 300 MG 24 hr tablet Take 1 tablet (300 mg total) by mouth daily.   busPIRone (BUSPAR) 10 MG tablet Take 1 tablet (10 mg total) by mouth 2 (two) times daily.   escitalopram (LEXAPRO) 20 MG tablet Take 1 tablet (20 mg total) by mouth daily.   fluticasone-salmeterol (ADVAIR DISKUS) 250-50 MCG/ACT AEPB Inhale 1 puff into the lungs in the morning and at bedtime.   lisinopril (ZESTRIL) 10 MG tablet TAKE 1 TABLET(10 MG) BY MOUTH DAILY   metFORMIN (GLUCOPHAGE-XR) 500 MG 24 hr tablet TAKE 1 TABLET(500 MG) BY MOUTH DAILY WITH BREAKFAST   montelukast (SINGULAIR) 10 MG tablet TAKE 1 TABLET(10 MG) BY MOUTH AT BEDTIME   rosuvastatin (CRESTOR) 10 MG tablet TAKE 1 TABLET(10 MG) BY MOUTH DAILY   triamcinolone cream (KENALOG) 0.1 % Apply 1 application. topically 2 (two) times daily.   [DISCONTINUED] meloxicam (MOBIC) 15 MG tablet TAKE 1 TABLET BY MOUTH EVERY DAY WITH A MEAL       05/04/2022   11:23 AM 08/30/2021    8:06 AM 08/01/2021    9:34 AM 06/20/2021   10:43 AM  GAD 7 : Generalized Anxiety Score  Nervous, Anxious, on Edge 3 1 0 3  Control/stop worrying 3 0 0 3  Worry too much - different things 3 0 0 3  Trouble relaxing 3 0 0 3  Restless 3 0 0 3  Easily annoyed or irritable 3 1 0 3  Afraid - awful might happen 2 0 0 3  Total GAD 7 Score 20 2 0 21  Anxiety Difficulty Extremely difficult Somewhat difficult  Extremely difficult       05/04/2022   11:22 AM 08/30/2021    8:06 AM 08/01/2021    9:34 AM  Depression screen PHQ 2/9  Decreased Interest 0 0 0  Down, Depressed, Hopeless 0 0 0  PHQ - 2 Score 0 0 0  Altered sleeping 0 1 0  Tired, decreased energy 0 1 2   Change in appetite 0 1 2  Feeling bad or failure about yourself  0 0 0  Trouble concentrating 0 0 0  Moving slowly or fidgety/restless 0 0 0  Suicidal thoughts 0 0 0  PHQ-9 Score 0 3 4  Difficult doing work/chores Not difficult at all Not difficult at all Somewhat difficult    BP Readings from Last 3 Encounters:  05/04/22 128/72  08/30/21 112/70  08/01/21 (!) 124/98    Physical Exam Constitutional:      Appearance: Normal appearance. She is well-developed.  HENT:     Right Ear: Ear canal and external ear normal. Tympanic membrane is  not erythematous or retracted.     Left Ear: Ear canal and external ear normal. Tympanic membrane is not erythematous or retracted.     Nose:     Right Sinus: Maxillary sinus tenderness and frontal sinus tenderness present.     Left Sinus: Maxillary sinus tenderness and frontal sinus tenderness present.     Mouth/Throat:     Mouth: No oral lesions.     Pharynx: Uvula midline. Posterior oropharyngeal erythema present. No oropharyngeal exudate.  Cardiovascular:     Rate and Rhythm: Normal rate and regular rhythm.     Pulses: Normal pulses.     Heart sounds: Normal heart sounds.  Pulmonary:     Effort: Pulmonary effort is normal.     Breath sounds: Normal breath sounds. No decreased breath sounds, wheezing or rales.  Musculoskeletal:     Cervical back: Normal range of motion.  Lymphadenopathy:     Cervical: No cervical adenopathy.  Skin:    General: Skin is warm and dry.  Neurological:     Mental Status: She is alert and oriented to person, place, and time.     Wt Readings from Last 3 Encounters:  05/04/22 197 lb (89.4 kg)  09/05/21 207 lb 12.8 oz (94.3 kg)  08/30/21 207 lb 12.8 oz (94.3 kg)    BP 128/72   Pulse 87   Temp 98.2 F (36.8 C) (Oral)   Ht _0  (1.651 m)   Wt 197 lb (89.4 kg)   SpO2 95%   BMI 32.78 kg/m   Assessment and Plan: 1. Acute non-recurrent maxillary sinusitis Continue Flonase Add Coricidin HBP -  amoxicillin-clavulanate (AUGMENTIN) 875-125 MG tablet; Take 1 tablet by mouth 2 (two) times daily for 10 days.  Dispense: 20 tablet; Refill: 0  2. Benign essential HTN Clinically stable exam with well controlled BP. Tolerating medications without side effects at this time.  3. Mild intermittent asthma, uncomplicated Back on Advair with improvement in symptoms   4. Depression, major, recurrent, in partial remission ([place]) Clinically stable on current regimen with good control of symptoms, No SI or HI. Will continue current therapy. 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  5. Type II diabetes mellitus with complication ([place]) Continue metformin and healthy diet Due for labs but will defer to CPX in February since values have been stable.   Partially dictated using Editor, commissioning. Any errors are unintentional.  Halina Maidens, MD [facility]  05/04/2022

## 2022-05-31 ENCOUNTER — Other Ambulatory Visit: Payer: Self-pay | Admitting: Internal Medicine

## 2022-05-31 DIAGNOSIS — E1169 Type 2 diabetes mellitus with other specified complication: Secondary | ICD-10-CM

## 2022-05-31 DIAGNOSIS — F3341 Major depressive disorder, recurrent, in partial remission: Secondary | ICD-10-CM

## 2022-05-31 NOTE — Telephone Encounter (Signed)
Requested Prescriptions  Pending Prescriptions Disp Refills   rosuvastatin (CRESTOR) 10 MG tablet [Pharmacy Med Name: ROSUVASTATIN 10MG  TABLETS] 90 tablet 1    Sig: TAKE 1 TABLET(10 MG) BY MOUTH DAILY     Cardiovascular:  Antilipid - Statins 2 Failed - 05/31/2022 11:32 AM      Failed - Lipid Panel in normal range within the last 12 months    Cholesterol, Total  Date Value Ref Range Status  10/26/2021 170 100 - 199 mg/dL Final   LDL Chol Calc (NIH)  Date Value Ref Range Status  10/26/2021 88 0 - 99 mg/dL Final   HDL  Date Value Ref Range Status  10/26/2021 54 >39 mg/dL Final   Triglycerides  Date Value Ref Range Status  10/26/2021 166 (H) 0 - 149 mg/dL Final         Passed - Cr in normal range and within 360 days    Creatinine, Ser  Date Value Ref Range Status  10/26/2021 0.70 0.57 - 1.00 mg/dL Final         Passed - Patient is not pregnant      Passed - Valid encounter within last 12 months    Recent Outpatient Visits           3 weeks ago Acute non-recurrent maxillary sinusitis   [place] [facility] at [facility], [facility], MD   8 months ago No-show for appointment   [facility] at [facility], [facility], MD   9 months ago Acute non-recurrent maxillary sinusitis   [place] [facility] at [facility], [facility], MD   10 months ago Bronchitis   [place] [facility] at [facility], [facility], MD   11 months ago Mild intermittent asthma, uncomplicated   [place] [facility] at [facility], [facility], MD       Future Appointments             In 2 months Nyoka Cowden Judithann Graves, MD [facility] Primary [facility] and Sports [facility] at [facility], [facility]             escitalopram (LEXAPRO) 20 MG tablet [Pharmacy Med Name: ESCITALOPRAM 20MG  TABLETS] 90 tablet 0    Sig: TAKE 1  TABLET(20 MG) BY MOUTH DAILY     Psychiatry:  Antidepressants - SSRI Failed - 05/31/2022 11:32 AM      Failed - Valid encounter within last 6 months    Recent Outpatient Visits           3 weeks ago Acute non-recurrent maxillary sinusitis   [place] [facility] at [facility], 06/02/2022, MD   8 months ago No-show for appointment   [facility] at [facility], [facility], MD   9 months ago Acute non-recurrent maxillary sinusitis   [place] [facility] at [facility], Nyoka Cowden, MD   10 months ago Bronchitis   [place] [facility] at [facility], Nyoka Cowden, MD   11 months ago Mild intermittent asthma, uncomplicated   [facility] at [facility], [facility], MD       Future Appointments             In 2 months [facility]  Rexene Edison, MD [facility] Primary Care and Sports Medicine at [facility], [facility]            Passed - Completed PHQ-2 or PHQ-9 in the last 360 days

## 2022-07-28 ENCOUNTER — Other Ambulatory Visit: Payer: Self-pay | Admitting: Internal Medicine

## 2022-07-28 DIAGNOSIS — J4520 Mild intermittent asthma, uncomplicated: Secondary | ICD-10-CM

## 2022-07-28 DIAGNOSIS — I10 Essential (primary) hypertension: Secondary | ICD-10-CM

## 2022-07-28 DIAGNOSIS — E118 Type 2 diabetes mellitus with unspecified complications: Secondary | ICD-10-CM

## 2022-07-28 DIAGNOSIS — F3341 Major depressive disorder, recurrent, in partial remission: Secondary | ICD-10-CM

## 2022-07-28 NOTE — Telephone Encounter (Signed)
Requested Prescriptions  Pending Prescriptions Disp Refills   lisinopril (ZESTRIL) 10 MG tablet [Pharmacy Med Name: LISINOPRIL 10MG  TABLETS] 90 tablet     Sig: TAKE 1 TABLET(10 MG) BY MOUTH DAILY     Cardiovascular:  ACE Inhibitors Failed - 07/28/2022  3:32 AM      Failed - Cr in normal range and within 180 days    Creatinine, Ser  Date Value Ref Range Status  10/26/2021 0.70 0.57 - 1.00 mg/dL Final         Failed - K in normal range and within 180 days    Potassium  Date Value Ref Range Status  10/26/2021 4.7 3.5 - 5.2 mmol/L Final         Failed - Valid encounter within last 6 months    Recent Outpatient Visits           2 months ago Acute non-recurrent maxillary sinusitis   [place] [facility] at [facility] [place], [facility], MD   10 months ago No-show for appointment   [facility] at [facility], [facility], MD   11 months ago Acute non-recurrent maxillary sinusitis   [place] [facility] at [facility], [facility], MD   12 months ago Bronchitis   [place] [facility] at [facility], [facility], MD   1 year ago Mild intermittent asthma, uncomplicated   [place] [facility] at [facility], [facility], MD       Future Appointments             In 2 weeks Nyoka Cowden, MD [facility] Primary Care & Sports Medicine at [facility], [facility]            Passed - Patient is not pregnant      Passed - Last BP in normal range    BP Readings from Last 1 Encounters:  05/04/22 128/72          metFORMIN (GLUCOPHAGE-XR) 500 MG 24 hr tablet [Pharmacy Med Name: METFORMIN ER 500MG  24HR TABS] 90 tablet     Sig: TAKE 1 TABLET(500 MG) BY MOUTH DAILY WITH BREAKFAST     Endocrinology:  Diabetes - Biguanides Failed - 07/28/2022  3:32 AM      Failed - HBA1C is between 0 and 7.9 and within 180 days    Hgb A1c  MFr Bld  Date Value Ref Range Status  10/26/2021 6.4 (H) 4.8 - 5.6 % Final    Comment:             Prediabetes: 5.7 - 6.4          Diabetes: >6.4          Glycemic control for adults with diabetes: <7.0          Failed - B12 Level in normal range and within 720 days    No results found for: "VITAMINB12"       Failed - Valid encounter within last 6 months    Recent Outpatient Visits           2 months ago Acute non-recurrent maxillary sinusitis   [place] [facility] at [facility], 10/28/2021, MD   10 months ago No-show for appointment   [facility] at [facility], [facility], MD   11 months ago  Acute non-recurrent maxillary sinusitis   [place] [facility] at [facility], Jesse Sans, MD   12 months ago [place] at [facility], Jesse Sans, MD   1 year ago Mild intermittent asthma, uncomplicated   [facility] Primary [facility] at [facility], Jesse Sans, MD       Future Appointments             In 2 weeks Glean Hess, MD [facility] Primary Care & Sports Medicine at [facility], [place] in normal range and within 360 days    Creatinine, Ser  Date Value Ref Range Status  10/26/2021 0.70 0.57 - 1.00 mg/dL Final         Passed - eGFR in normal range and within 360 days    GFR calc Af Amer  Date Value Ref Range Status  12/30/2019 105 >59 mL/min/1.73 Final    Comment:    **[facility] currently reports eGFR in compliance with the current**   recommendations of the [employer]. [facility] will   update reporting as new guidelines are published from the NKF-ASN   Task force.    GFR calc non Af Amer  Date Value Ref Range Status  12/30/2019 91 >59 mL/min/1.73 Final   eGFR  Date Value Ref Range Status  10/26/2021 101 >59 mL/min/1.73 Final          Passed - CBC within normal limits and completed in the last 12 months    WBC  Date Value Ref Range Status  10/26/2021 6.0 3.4 - 10.8 x10E3/uL Final   RBC  Date Value Ref Range Status  10/26/2021 4.86 3.77 - 5.28 x10E6/uL Final  11/16/2020 5 4.04 - 5.48 M/uL Final   Hemoglobin  Date Value Ref Range Status  10/26/2021 14.3 11.1 - 15.9 g/dL Final   Hematocrit  Date Value Ref Range Status  10/26/2021 43.6 34.0 - 46.6 % Final   MCHC  Date Value Ref Range Status  10/26/2021 32.8 31.5 - 35.7 g/dL Final   [facility]  Date Value Ref Range Status  10/26/2021 29.4 26.6 - 33.0 pg Final   MCV  Date Value Ref Range Status  10/26/2021 90 79 - 97 fL Final   No results found for: "PLTCOUNTKUC", "LABPLAT", "POCPLA" RDW  Date Value Ref Range Status  10/26/2021 14.0 11.7 - 15.4 % Final          montelukast (SINGULAIR) 10 MG tablet [Pharmacy Med Name: MONTELUKAST 10MG  TABLETS] 90 tablet 0    Sig: TAKE 1 TABLET(10 MG) BY MOUTH AT BEDTIME     Pulmonology:  Leukotriene Inhibitors Passed - 07/28/2022  3:32 AM      Passed - Valid encounter within last 12 months    Recent Outpatient Visits           2 months ago Acute non-recurrent maxillary sinusitis   [place] at [facility], Jesse Sans, MD   10 months ago No-show for appointment   [place] at [facility], Jesse Sans, MD   11 months ago Acute non-recurrent maxillary sinusitis   [facility] [place] at [facility], Jesse Sans, MD   12 months ago [place] at [facility]  Robert Bellow, MD   1 year ago Mild intermittent asthma, uncomplicated   [facility] Primary Care & Sports [facility] at [facility], Jesse Sans, MD       Future Appointments             In 2 weeks Army Melia, Jesse Sans, MD [place] at [facility], [facility]

## 2022-07-28 NOTE — Telephone Encounter (Signed)
Requested medication (s) are due for refill today: yes  Requested medication (s) are on the active medication list: yes  Last refill:  05/02/22 #90 for each med   Future visit scheduled: yes  Notes to clinic:  labs overdue    Requested Prescriptions  Pending Prescriptions Disp Refills   lisinopril (ZESTRIL) 10 MG tablet [Pharmacy Med Name: LISINOPRIL 10MG  TABLETS] 90 tablet     Sig: TAKE 1 TABLET(10 MG) BY MOUTH DAILY     Cardiovascular:  ACE Inhibitors Failed - 07/28/2022  3:32 AM      Failed - Cr in normal range and within 180 days    Creatinine, Ser  Date Value Ref Range Status  10/26/2021 0.70 0.57 - 1.00 mg/dL Final         Failed - K in normal range and within 180 days    Potassium  Date Value Ref Range Status  10/26/2021 4.7 3.5 - 5.2 mmol/L Final         Failed - Valid encounter within last 6 months    Recent Outpatient Visits           2 months ago Acute non-recurrent maxillary sinusitis   [place] [facility] [place] at [facility], Jesse Sans, MD   10 months ago No-show for appointment   [place] at [facility], Jesse Sans, MD   11 months ago Acute non-recurrent maxillary sinusitis   [place] at [facility], Jesse Sans, MD   12 months ago [place] at [facility], Jesse Sans, MD   1 year ago Mild intermittent asthma, uncomplicated   [place] at [facility], Jesse Sans, MD       Future Appointments             In 2 weeks Glean Hess, MD [place] at [facility] [place] - Patient is not pregnant      Passed - Last BP in normal range    BP Readings from Last 1 Encounters:  05/04/22 128/72          metFORMIN (GLUCOPHAGE-XR) 500 MG 24 hr tablet [Pharmacy Med Name: METFORMIN ER  500MG  24HR TABS] 90 tablet     Sig: TAKE 1 TABLET(500 MG) BY MOUTH DAILY WITH BREAKFAST     Endocrinology:  Diabetes - Biguanides Failed - 07/28/2022  3:32 AM      Failed - HBA1C is between 0 and 7.9 and within 180 days    Hgb A1c MFr Bld  Date Value Ref Range Status  10/26/2021 6.4 (H) 4.8 - 5.6 % Final    Comment:             Prediabetes: 5.7 - 6.4          Diabetes: >6.4          Glycemic control for adults with diabetes: <7.0          Failed - B12 Level in normal range and within 720 days    No results found for: "VITAMINB12"       Failed - Valid encounter within last 6 months    Recent Outpatient Visits           2 months ago Acute non-recurrent maxillary sinusitis  [place] at [facility], Jesse Sans, MD   10 months ago No-show for appointment   [place] at [facility], Jesse Sans, MD   11 months ago Acute non-recurrent maxillary sinusitis   [place] [facility] at [facility], Jesse Sans, MD   12 months ago [place] at [facility], Jesse Sans, MD   1 year ago Mild intermittent asthma, uncomplicated   [facility] at [facility], Jesse Sans, MD       Future Appointments             In 2 weeks Glean Hess, MD [facility] at [facility], [place] in normal range and within 360 days    Creatinine, Ser  Date Value Ref Range Status  10/26/2021 0.70 0.57 - 1.00 mg/dL Final         Passed - eGFR in normal range and within 360 days    GFR calc Af Amer  Date Value Ref Range Status  12/30/2019 105 >59 mL/min/1.73 Final    Comment:    **[facility] currently reports eGFR in compliance with the current**   recommendations of the [employer]. [facility] will   update reporting as  new guidelines are published from the NKF-ASN   Task force.    GFR calc non Af Amer  Date Value Ref Range Status  12/30/2019 91 >59 mL/min/1.73 Final   eGFR  Date Value Ref Range Status  10/26/2021 101 >59 mL/min/1.73 Final         Passed - CBC within normal limits and completed in the last 12 months    WBC  Date Value Ref Range Status  10/26/2021 6.0 3.4 - 10.8 x10E3/uL Final   RBC  Date Value Ref Range Status  10/26/2021 4.86 3.77 - 5.28 x10E6/uL Final  11/16/2020 5 4.04 - 5.48 M/uL Final   Hemoglobin  Date Value Ref Range Status  10/26/2021 14.3 11.1 - 15.9 g/dL Final   Hematocrit  Date Value Ref Range Status  10/26/2021 43.6 34.0 - 46.6 % Final   MCHC  Date Value Ref Range Status  10/26/2021 32.8 31.5 - 35.7 g/dL Final   [facility]  Date Value Ref Range Status  10/26/2021 29.4 26.6 - 33.0 pg Final   MCV  Date Value Ref Range Status  10/26/2021 90 79 - 97 fL Final   No results found for: "PLTCOUNTKUC", "LABPLAT", "POCPLA" RDW  Date Value Ref Range Status  10/26/2021 14.0 11.7 - 15.4 % Final         Signed Prescriptions Disp Refills   montelukast (SINGULAIR) 10 MG tablet 90 tablet 0    Sig: TAKE 1 TABLET(10 MG) BY MOUTH AT BEDTIME     Pulmonology:  Leukotriene Inhibitors Passed - 07/28/2022  3:32 AM      Passed - Valid encounter within last 12 months    Recent Outpatient Visits           2 months ago Acute non-recurrent maxillary sinusitis   [place] [facility] [place] at [facility], Jesse Sans, MD   10 months ago No-show for appointment   [place] at [facility], Jesse Sans, MD  11 months ago Acute non-recurrent maxillary sinusitis   [place] [facility] at [facility], Nyoka Cowden, MD   12 months ago Bronchitis   [facility] at [facility], Nyoka Cowden, MD   1 year ago Mild intermittent asthma, uncomplicated    [facility] at [facility], Nyoka Cowden, MD       Future Appointments             In 2 weeks Judithann Graves, Nyoka Cowden, MD [facility] at [facility], [facility]

## 2022-07-29 ENCOUNTER — Other Ambulatory Visit: Payer: Self-pay | Admitting: Internal Medicine

## 2022-07-29 DIAGNOSIS — F3341 Major depressive disorder, recurrent, in partial remission: Secondary | ICD-10-CM

## 2022-08-11 ENCOUNTER — Encounter: Payer: Self-pay | Admitting: Internal Medicine

## 2022-08-11 ENCOUNTER — Ambulatory Visit (INDEPENDENT_AMBULATORY_CARE_PROVIDER_SITE_OTHER): Payer: BC Managed Care – PPO | Admitting: Internal Medicine

## 2022-08-11 VITALS — BP 130/84 | HR 85 | Ht 65.0 in | Wt 202.0 lb

## 2022-08-11 DIAGNOSIS — E1169 Type 2 diabetes mellitus with other specified complication: Secondary | ICD-10-CM

## 2022-08-11 DIAGNOSIS — I10 Essential (primary) hypertension: Secondary | ICD-10-CM | POA: Diagnosis not present

## 2022-08-11 DIAGNOSIS — F3341 Major depressive disorder, recurrent, in partial remission: Secondary | ICD-10-CM

## 2022-08-11 DIAGNOSIS — Z0000 Encounter for general adult medical examination without abnormal findings: Secondary | ICD-10-CM | POA: Diagnosis not present

## 2022-08-11 DIAGNOSIS — Z1211 Encounter for screening for malignant neoplasm of colon: Secondary | ICD-10-CM | POA: Diagnosis not present

## 2022-08-11 DIAGNOSIS — E785 Hyperlipidemia, unspecified: Secondary | ICD-10-CM

## 2022-08-11 DIAGNOSIS — Z1231 Encounter for screening mammogram for malignant neoplasm of breast: Secondary | ICD-10-CM | POA: Diagnosis not present

## 2022-08-11 DIAGNOSIS — E118 Type 2 diabetes mellitus with unspecified complications: Secondary | ICD-10-CM

## 2022-08-11 NOTE — Patient Instructions (Signed)
Call [facility] to schedule your mammogram at [phone].  

## 2022-08-11 NOTE — Assessment & Plan Note (Signed)
Tolerating statin medication without side effects at this time LDL is  Lab Results  Component Value Date   LDLCALC 88 10/26/2021   with a goal of < 70.

## 2022-08-11 NOTE — Assessment & Plan Note (Signed)
Clinically stable without s/s of hypoglycemia. Tolerating metformin well without side effects or other concerns. Continue without changes at this time. Last A1C 6.4

## 2022-08-11 NOTE — Assessment & Plan Note (Addendum)
Clinically stable exam with well controlled BP on lisinopril. Tolerating medications without side effects. Pt to continue current regimen and low sodium diet. Increase exercise and continue to work on weight loss

## 2022-08-11 NOTE — Progress Notes (Signed)
Date:  08/11/2022   Name:  Amanda Bautista   DOB:  11-05-1965   MRN:  [id number]   Chief Complaint: Annual Exam Amanda Bautista is a 57 y.o. female who presents today for her Complete Annual Exam. She feels well. She reports exercising. She reports she is sleeping well. Breast complaints - none.  Mammogram: 10/2021 DEXA: none Pap smear: discontinued Colonoscopy: 07/2011  Health Maintenance Due  Topic Date Due   Zoster Vaccines- Shingrix (1 of 2) Never done   COLONOSCOPY (Pts 45-101yr Insurance coverage will need to be confirmed)  07/04/2021   OPHTHALMOLOGY EXAM  10/11/2021   HEMOGLOBIN A1C  04/27/2022   COVID-19 Vaccine (6 - 2023-24 season) 05/24/2022    Immunization History  Administered Date(s) Administered   Influenza, Seasonal, Injecte, Preservative Fre 03/24/2008   Influenza,inj,Quad PF,6+ Mos 04/24/2018, 03/05/2019, 04/09/2020, 04/22/2021   Influenza-Unspecified 04/04/2016, 04/28/2022   MMR 02/01/2016   Moderna Sars-Covid-2 Vaccination 08/27/2019, 09/26/2019, 04/22/2020, 02/17/2021   PPD Test 01/20/2019   Pneumococcal Conjugate-13 07/09/2015   Pneumococcal Polysaccharide-23 05/01/2019   Pneumococcal-Unspecified 07/09/2015   Tdap 07/09/2015   Unspecified SARS-COV-2 Vaccination 03/29/2022    Hypertension This is a chronic problem. The problem is controlled. Pertinent negatives include no chest pain, headaches, palpitations or shortness of breath. Past treatments include ACE inhibitors. The current treatment provides significant improvement.  Diabetes She presents for her follow-up diabetic visit. She has type 2 diabetes mellitus. Pertinent negatives for hypoglycemia include no dizziness, headaches, nervousness/anxiousness or tremors. Pertinent negatives for diabetes include no chest pain, no fatigue, no polydipsia and no polyuria. Current diabetic treatment includes oral agent (monotherapy) (metformin).  Hyperlipidemia This is a chronic problem. The problem is controlled.  Pertinent negatives include no chest pain or shortness of breath. Current antihyperlipidemic treatment includes statins.  Depression        This is a chronic problem.The problem is unchanged.  Associated symptoms include no fatigue and no headaches.  Treatments tried: bupropion/buspar; PRN xanax.   Lab Results  Component Value Date   NA 141 10/26/2021   K 4.7 10/26/2021   CO2 26 10/26/2021   GLUCOSE 111 (H) 10/26/2021   BUN 13 10/26/2021   CREATININE 0.70 10/26/2021   CALCIUM 9.6 10/26/2021   EGFR 101 10/26/2021   GFRNONAA 91 12/30/2019   Lab Results  Component Value Date   CHOL 170 10/26/2021   HDL 54 10/26/2021   LDLCALC 88 10/26/2021   TRIG 166 (H) 10/26/2021   CHOLHDL 3.1 10/26/2021   Lab Results  Component Value Date   TSH 1.400 10/26/2021   Lab Results  Component Value Date   HGBA1C 6.4 (H) 10/26/2021   Lab Results  Component Value Date   WBC 6.0 10/26/2021   HGB 14.3 10/26/2021   HCT 43.6 10/26/2021   MCV 90 10/26/2021   PLT 291 10/26/2021   Lab Results  Component Value Date   ALT 25 10/26/2021   AST 19 10/26/2021   ALKPHOS 88 10/26/2021   BILITOT 0.4 10/26/2021   No results found for: "25OHVITD2", "25OHVITD3", "VD25OH"   Review of Systems  Constitutional:  Negative for chills, fatigue and fever.  HENT:  Negative for congestion, hearing loss, tinnitus, trouble swallowing and voice change.   Eyes:  Negative for visual disturbance.  Respiratory:  Negative for cough, chest tightness, shortness of breath and wheezing.   Cardiovascular:  Negative for chest pain, palpitations and leg swelling.  Gastrointestinal:  Negative for abdominal pain, constipation, diarrhea and vomiting.  Endocrine: Negative for polydipsia  and polyuria.  Genitourinary:  Negative for dysuria, frequency, genital sores, vaginal bleeding and vaginal discharge.  Musculoskeletal:  Negative for arthralgias, gait problem and joint swelling.  Skin:  Negative for color change and rash.   Neurological:  Negative for dizziness, tremors, light-headedness and headaches.  Hematological:  Negative for adenopathy. Does not bruise/bleed easily.  Psychiatric/Behavioral:  Positive for depression and dysphoric mood (improving since the holidays). Negative for sleep disturbance. The patient is not nervous/anxious.     Patient Active Problem List   Diagnosis Date Noted   Sprain of ulnar collateral ligament of metacarpophalangeal (MCP) joint of left thumb 02/22/2021   S/P TAH-BSO 06/27/2020   Xerosis of skin 11/11/2018   Hypertensive retinopathy 05/06/2018   Type II diabetes mellitus with complication ([place]) [id number]   Benign essential HTN 04/15/2018   Hyperlipidemia associated with type 2 diabetes mellitus ([place]) 04/15/2018   Depression, major, recurrent, in partial remission ([place]) 04/15/2018   Generalized anxiety disorder 04/15/2018   Mild intermittent asthma, uncomplicated [id number]    No Known Allergies  Past Surgical History:  Procedure Laterality Date   OOPHORECTOMY     one ovary left   TOTAL ABDOMINAL HYSTERECTOMY      Social History   Tobacco Use   Smoking status: Former    Types: Cigarettes    Quit date: 10/18/2005    Years since quitting: 16.8   Smokeless tobacco: Never  Vaping Use   Vaping Use: Never used  Substance Use Topics   Alcohol use: Never   Drug use: Never     Medication list has been reviewed and updated.  Current Meds  Medication Sig   albuterol (VENTOLIN HFA) 108 (90 Base) MCG/ACT inhaler INHALE 1 TO 2 PUFFS INTO THE LUNGS EVERY 6 HOURS AS NEEDED FOR WHEEZING OR SHORTNESS OF BREATH   ALPRAZolam (XANAX) 0.25 MG tablet TAKE 1 TABLET(0.25 MG) BY MOUTH TWICE DAILY AS NEEDED FOR ANXIETY   APPLE CIDER VINEGAR PO Take by mouth.   Ascorbic Acid (VITAMIN C) 100 MG tablet Take 100 mg by mouth daily.   buPROPion (WELLBUTRIN XL) 300 MG 24 hr tablet TAKE 1 TABLET(300 MG) BY MOUTH DAILY   busPIRone (BUSPAR) 10 MG tablet Take 1 tablet (10 mg total) by  mouth 2 (two) times daily.   Elderberry 500 MG CAPS Take by mouth.   escitalopram (LEXAPRO) 20 MG tablet TAKE 1 TABLET(20 MG) BY MOUTH DAILY   fluticasone-salmeterol (ADVAIR DISKUS) 250-50 MCG/ACT AEPB Inhale 1 puff into the lungs in the morning and at bedtime.   lisinopril (ZESTRIL) 10 MG tablet TAKE 1 TABLET(10 MG) BY MOUTH DAILY   metFORMIN (GLUCOPHAGE-XR) 500 MG 24 hr tablet TAKE 1 TABLET(500 MG) BY MOUTH DAILY WITH BREAKFAST   montelukast (SINGULAIR) 10 MG tablet TAKE 1 TABLET(10 MG) BY MOUTH AT BEDTIME   rosuvastatin (CRESTOR) 10 MG tablet TAKE 1 TABLET(10 MG) BY MOUTH DAILY   triamcinolone cream (KENALOG) 0.1 % Apply 1 application. topically 2 (two) times daily.       08/11/2022    9:16 AM 05/04/2022   11:23 AM 08/30/2021    8:06 AM 08/01/2021    9:34 AM  GAD 7 : Generalized Anxiety Score  Nervous, Anxious, on Edge 2 3 1 $ 0  Control/stop worrying 1 3 0 0  Worry too much - different things 1 3 0 0  Trouble relaxing 1 3 0 0  Restless 1 3 0 0  Easily annoyed or irritable 0 3 1 0  Afraid - awful might happen  1 2 0 0  Total GAD 7 Score 7 20 2 $ 0  Anxiety Difficulty Somewhat difficult Extremely difficult Somewhat difficult        08/11/2022    9:15 AM 05/04/2022   11:22 AM 08/30/2021    8:06 AM  Depression screen PHQ 2/9  Decreased Interest 1 0 0  Down, Depressed, Hopeless 1 0 0  PHQ - 2 Score 2 0 0  Altered sleeping 0 0 1  Tired, decreased energy 1 0 1  Change in appetite 1 0 1  Feeling bad or failure about yourself  0 0 0  Trouble concentrating 1 0 0  Moving slowly or fidgety/restless 1 0 0  Suicidal thoughts 0 0 0  PHQ-9 Score 6 0 3  Difficult doing work/chores Somewhat difficult Not difficult at all Not difficult at all    BP Readings from Last 3 Encounters:  08/11/22 130/84  05/04/22 128/72  08/30/21 112/70    Physical Exam Vitals and nursing note reviewed.  Constitutional:      General: She is not in acute distress.    Appearance: She is well-developed.  HENT:      Head: Normocephalic and atraumatic.     Right Ear: Tympanic membrane and ear canal normal.     Left Ear: Tympanic membrane and ear canal normal.     Nose:     Right Sinus: No maxillary sinus tenderness.     Left Sinus: No maxillary sinus tenderness.  Eyes:     General: No scleral icterus.       Right eye: No discharge.        Left eye: No discharge.     Conjunctiva/sclera: Conjunctivae normal.  Neck:     Thyroid: No thyromegaly.     Vascular: No carotid bruit.  Cardiovascular:     Rate and Rhythm: Normal rate and regular rhythm.     Pulses: Normal pulses.     Heart sounds: Normal heart sounds.  Pulmonary:     Effort: Pulmonary effort is normal. No respiratory distress.     Breath sounds: No wheezing.  Chest:  Breasts:    Right: No mass, nipple discharge, skin change or tenderness.     Left: No mass, nipple discharge, skin change or tenderness.  Abdominal:     General: Bowel sounds are normal.     Palpations: Abdomen is soft.     Tenderness: There is no abdominal tenderness.  Musculoskeletal:     Cervical back: Normal range of motion. No erythema.     Right lower leg: No edema.     Left lower leg: No edema.  Lymphadenopathy:     Cervical: No cervical adenopathy.  Skin:    General: Skin is warm and dry.     Findings: No rash.  Neurological:     Mental Status: She is alert and oriented to person, place, and time.     Cranial Nerves: No cranial nerve deficit.     Sensory: No sensory deficit.     Deep Tendon Reflexes: Reflexes are normal and symmetric.  Psychiatric:        Attention and Perception: Attention normal.        Mood and Affect: Mood normal.    Diabetic Foot Exam - Simple   Simple Foot Form Diabetic Foot exam was performed with the following findings: Yes 08/11/2022  9:31 AM  Visual Inspection No deformities, no ulcerations, no other skin breakdown bilaterally: Yes Sensation Testing Intact to touch and monofilament testing bilaterally:  Yes Pulse  Check Posterior Tibialis and Dorsalis pulse intact bilaterally: Yes Comments      Wt Readings from Last 3 Encounters:  08/11/22 202 lb (91.6 kg)  05/04/22 197 lb (89.4 kg)  09/05/21 207 lb 12.8 oz (94.3 kg)    BP 130/84 (BP Location: Right Arm, Cuff Size: Normal)   Pulse 85   Ht 5' 5"$  (1.651 m)   Wt 202 lb (91.6 kg)   SpO2 95%   BMI 33.61 kg/m   Assessment and Plan: Problem List Items Addressed This Visit       Cardiovascular and Mediastinum   Benign essential HTN (Chronic)    Clinically stable exam with well controlled BP on lisinopril. Tolerating medications without side effects. Pt to continue current regimen and low sodium diet. Increase exercise and continue to work on weight loss       Relevant Orders   CBC with Differential/Platelet   Comprehensive metabolic panel     Endocrine   Hyperlipidemia associated with type 2 diabetes mellitus ([place]) (Chronic)    Tolerating statin medication without side effects at this time LDL is  Lab Results  Component Value Date   LDLCALC 88 10/26/2021  with a goal of < 70.        Relevant Orders   Lipid panel   Type II diabetes mellitus with complication (HCC) (Chronic)    Clinically stable without s/s of hypoglycemia. Tolerating metformin well without side effects or other concerns. Continue without changes at this time. Last A1C 6.4      Relevant Orders   Comprehensive metabolic panel   Hemoglobin A1c   Microalbumin / creatinine urine ratio     Other   Depression, major, recurrent, in partial remission (HCC) (Chronic)   Relevant Orders   TSH   Other Visit Diagnoses     Annual physical exam    -  Primary   Relevant Orders   CBC with Differential/Platelet   Comprehensive metabolic panel   Hemoglobin A1c   Lipid panel   Microalbumin / creatinine urine ratio   Encounter for screening mammogram for breast cancer       Relevant Orders   MM 3D SCREEN BREAST BILATERAL   Colon cancer screening        Relevant Orders   Ambulatory referral to Gastroenterology        Partially dictated using Dragon software. Any errors are unintentional.  Halina Maidens, MD [facility]  08/11/2022

## 2022-08-14 ENCOUNTER — Other Ambulatory Visit: Payer: Self-pay

## 2022-08-14 ENCOUNTER — Telehealth: Payer: Self-pay

## 2022-08-14 DIAGNOSIS — Z1211 Encounter for screening for malignant neoplasm of colon: Secondary | ICD-10-CM

## 2022-08-14 LAB — HEMOGLOBIN A1C
Est. average glucose Bld gHb Est-mCnc: 137 mg/dL
Hgb A1c MFr Bld: 6.4 % — ABNORMAL HIGH (ref 4.8–5.6)

## 2022-08-14 LAB — CBC WITH DIFFERENTIAL/PLATELET
Basophils Absolute: 0 10*3/uL (ref 0.0–0.2)
Basos: 0 %
EOS (ABSOLUTE): 0.2 10*3/uL (ref 0.0–0.4)
Eos: 4 %
Hematocrit: 43.6 % (ref 34.0–46.6)
Hemoglobin: 14.1 g/dL (ref 11.1–15.9)
Immature Grans (Abs): 0 10*3/uL (ref 0.0–0.1)
Immature Granulocytes: 0 %
Lymphocytes Absolute: 2 10*3/uL (ref 0.7–3.1)
Lymphs: 38 %
MCH: 29 pg (ref 26.6–33.0)
MCHC: 32.3 g/dL (ref 31.5–35.7)
MCV: 90 fL (ref 79–97)
Monocytes Absolute: 0.5 10*3/uL (ref 0.1–0.9)
Monocytes: 8 %
Neutrophils Absolute: 2.7 10*3/uL (ref 1.4–7.0)
Neutrophils: 50 %
Platelets: 286 10*3/uL (ref 150–450)
RBC: 4.87 x10E6/uL (ref 3.77–5.28)
RDW: 13.1 % (ref 11.7–15.4)
WBC: 5.4 10*3/uL (ref 3.4–10.8)

## 2022-08-14 LAB — COMPREHENSIVE METABOLIC PANEL
ALT: 18 IU/L (ref 0–32)
AST: 13 IU/L (ref 0–40)
Albumin/Globulin Ratio: 2 (ref 1.2–2.2)
Albumin: 4.5 g/dL (ref 3.8–4.9)
Alkaline Phosphatase: 84 IU/L (ref 44–121)
BUN/Creatinine Ratio: 14 (ref 9–23)
BUN: 10 mg/dL (ref 6–24)
Bilirubin Total: 0.3 mg/dL (ref 0.0–1.2)
CO2: 25 mmol/L (ref 20–29)
Calcium: 9.7 mg/dL (ref 8.7–10.2)
Chloride: 101 mmol/L (ref 96–106)
Creatinine, Ser: 0.7 mg/dL (ref 0.57–1.00)
Globulin, Total: 2.3 g/dL (ref 1.5–4.5)
Glucose: 109 mg/dL — ABNORMAL HIGH (ref 70–99)
Potassium: 4.7 mmol/L (ref 3.5–5.2)
Sodium: 143 mmol/L (ref 134–144)
Total Protein: 6.8 g/dL (ref 6.0–8.5)
eGFR: 101 mL/min/{1.73_m2} (ref >59)

## 2022-08-14 LAB — MICROALBUMIN / CREATININE URINE RATIO
Creatinine, Urine: 57.2 mg/dL
Microalb/Creat Ratio: 19 mg/g creat (ref 0–29)
Microalbumin, Urine: 10.7 ug/mL

## 2022-08-14 LAB — TSH: TSH: 0.939 u[IU]/mL (ref 0.450–4.500)

## 2022-08-14 LAB — LIPID PANEL
Chol/HDL Ratio: 3.5 ratio (ref 0.0–4.4)
Cholesterol, Total: 173 mg/dL (ref 100–199)
HDL: 50 mg/dL (ref >39)
LDL Chol Calc (NIH): 99 mg/dL (ref 0–99)
Triglycerides: 139 mg/dL (ref 0–149)
VLDL Cholesterol Cal: 24 mg/dL (ref 5–40)

## 2022-08-14 MED ORDER — NA SULFATE-K SULFATE-MG SULF 17.5-3.13-1.6 GM/177ML PO SOLN: 1.0000 | Freq: Once | ORAL | 0 refills | Status: AC

## 2022-08-14 NOTE — Telephone Encounter (Signed)
Gastroenterology Pre-Procedure Review  Request Date: 10/06/22 Requesting Physician: Dr. Allen Norris  PATIENT REVIEW QUESTIONS: The patient responded to the following health history questions as indicated:    1. Are you having any GI issues? no 2. Do you have a personal history of Polyps? no 3. Do you have a family history of Colon Cancer or Polyps? no 4. Diabetes Mellitus? Yes patient has been advised to stop Metformin 2 days prior to colonoscopy 5. Joint replacements in the past 12 months?no 6. Major health problems in the past 3 months?no 7. Any artificial heart valves, MVP, or defibrillator?no    MEDICATIONS & ALLERGIES:    Patient reports the following regarding taking any anticoagulation/antiplatelet therapy:   Plavix, Coumadin, Eliquis, Xarelto, Lovenox, Pradaxa, Brilinta, or Effient? no Aspirin? no  Patient confirms/reports the following medications:  Current Outpatient Medications  Medication Sig Dispense Refill   albuterol (VENTOLIN HFA) 108 (90 Base) MCG/ACT inhaler INHALE 1 TO 2 PUFFS INTO THE LUNGS EVERY 6 HOURS AS NEEDED FOR WHEEZING OR SHORTNESS OF BREATH 6.7 g 3   ALPRAZolam (XANAX) 0.25 MG tablet TAKE 1 TABLET(0.25 MG) BY MOUTH TWICE DAILY AS NEEDED FOR ANXIETY 60 tablet 0   APPLE CIDER VINEGAR PO Take by mouth.     Ascorbic Acid (VITAMIN C) 100 MG tablet Take 100 mg by mouth daily.     buPROPion (WELLBUTRIN XL) 300 MG 24 hr tablet TAKE 1 TABLET(300 MG) BY MOUTH DAILY 90 tablet 0   busPIRone (BUSPAR) 10 MG tablet Take 1 tablet (10 mg total) by mouth 2 (two) times daily. 180 tablet 1   Elderberry 500 MG CAPS Take by mouth.     escitalopram (LEXAPRO) 20 MG tablet TAKE 1 TABLET(20 MG) BY MOUTH DAILY 90 tablet 0   fluticasone-salmeterol (ADVAIR DISKUS) 250-50 MCG/ACT AEPB Inhale 1 puff into the lungs in the morning and at bedtime. 60 each 2   lisinopril (ZESTRIL) 10 MG tablet TAKE 1 TABLET(10 MG) BY MOUTH DAILY 90 tablet 0   metFORMIN (GLUCOPHAGE-XR) 500 MG 24 hr tablet TAKE 1  TABLET(500 MG) BY MOUTH DAILY WITH BREAKFAST 90 tablet 0   montelukast (SINGULAIR) 10 MG tablet TAKE 1 TABLET(10 MG) BY MOUTH AT BEDTIME 90 tablet 0   rosuvastatin (CRESTOR) 10 MG tablet TAKE 1 TABLET(10 MG) BY MOUTH DAILY 90 tablet 1   triamcinolone cream (KENALOG) 0.1 % Apply 1 application. topically 2 (two) times daily. 80 g 0   No current facility-administered medications for this visit.    Patient confirms/reports the following allergies:  No Known Allergies  No orders of the defined types were placed in this encounter.   AUTHORIZATION INFORMATION Primary Insurance: 1D#: Group #:  Secondary Insurance: 1D#: Group #:  SCHEDULE INFORMATION: Date: 10/06/22 Time: Location: [place]

## 2022-08-29 ENCOUNTER — Other Ambulatory Visit: Payer: Self-pay | Admitting: Internal Medicine

## 2022-08-29 DIAGNOSIS — F3341 Major depressive disorder, recurrent, in partial remission: Secondary | ICD-10-CM

## 2022-09-06 ENCOUNTER — Encounter: Payer: Self-pay | Admitting: Internal Medicine

## 2022-09-06 ENCOUNTER — Ambulatory Visit: Payer: Self-pay | Admitting: *Deleted

## 2022-09-06 ENCOUNTER — Encounter: Payer: Self-pay | Admitting: *Deleted

## 2022-09-06 NOTE — Telephone Encounter (Signed)
This encounter was created in error - please disregard. Call disconnected

## 2022-09-06 NOTE — Telephone Encounter (Signed)
  Chief Complaint: Hypertension Symptoms: BP 145/105 at workplace this am, dizzy, SOB with exertion at that time, not presently. BP once home this afternoon 147/104 "After a nap and a xanax." Frequency: Today Pertinent Negatives: Patient denies missed doses of med, dizziness, headache, SOB Disposition: '[]'$ ED /'[]'$ Urgent Care (no appt availability in office) / '[x]'$ Appointment(In office/virtual)/ '[]'$  [place] [facility]/ '[]'$ Home Care/ '[]'$ Refused Recommended Disposition /'[]'$ [place] [facility]/ '[]'$  Follow-up with PCP Additional Notes: Secured first available appt for tomorrow with Dr. Ronnald Ramp. Care advise provided, pt verbalizes understanding. Reason for Disposition  Systolic BP  >= 0000000 OR Diastolic >= [id number]  Answer Assessment - Initial Assessment Questions 1. BLOOD PRESSURE: "What is the blood pressure?" "Did you take at least two measurements 5 minutes apart?"     145/105  this afternoon at workplace 2. ONSET: "When did you take your blood pressure?"     This AM 3. HOW: "How did you take your blood pressure?" (e.g., automatic home BP monitor, visiting nurse)     Workplace.  Home monitor now after nap and Xanax 147/104   HR 89 4. HISTORY: "Do you have a history of high blood pressure?"     yes 5. MEDICINES: "Are you taking any medicines for blood pressure?" "Have you missed any doses recently?"     Yes, no missed doses 6. OTHER SYMPTOMS: "Do you have any symptoms?" (e.g., blurred vision, chest pain, difficulty breathing, headache, weakness)     Dizziness, not presently. HR 112. SOB with exertion today.  Protocols used: Blood Pressure - High-A-AH

## 2022-09-07 ENCOUNTER — Ambulatory Visit: Payer: BC Managed Care – PPO | Admitting: Family Medicine

## 2022-09-07 ENCOUNTER — Encounter: Payer: Self-pay | Admitting: Family Medicine

## 2022-09-07 VITALS — BP 118/78 | HR 63 | Ht 65.0 in | Wt 197.0 lb

## 2022-09-07 DIAGNOSIS — I10 Essential (primary) hypertension: Secondary | ICD-10-CM

## 2022-09-07 NOTE — Patient Instructions (Addendum)
How to Take Your Blood Pressure Blood pressure is a measurement of how strongly your blood is pressing against the walls of your arteries. Arteries are blood vessels that carry blood from your heart throughout your body. Your health care provider takes your blood pressure at each office visit. You can also take your own blood pressure at home with a blood pressure monitor. You may need to take your own blood pressure to: Confirm a diagnosis of high blood pressure (hypertension). Monitor your blood pressure over time. Make sure your blood pressure medicine is working. Supplies needed: Blood pressure monitor. A chair to sit in. This should be a chair where you can sit upright with your back supported. Do not sit on a soft couch or an armchair. Table or desk. Small notebook and pencil or pen. How to prepare To get the most accurate reading, avoid the following for 30 minutes before you check your blood pressure: Drinking caffeine. Drinking alcohol. Eating. Smoking. Exercising. Five minutes before you check your blood pressure: Use the bathroom and urinate so that you have an empty bladder. Sit quietly in a chair. Do not talk. How to take your blood pressure To check your blood pressure, follow the instructions in the manual that came with your blood pressure monitor. If you have a digital blood pressure monitor, the instructions may be as follows: Sit up straight in a chair. Place your feet on the floor. Do not cross your ankles or legs. Rest your left arm at the level of your heart on a table or desk or on the arm of a chair. Pull up your shirt sleeve. Wrap the blood pressure cuff around the upper part of your left arm, 1 inch (2.5 cm) above your elbow. It is best to wrap the cuff around bare skin. Fit the cuff snugly, but not too tightly, around your arm. You should be able to place only one finger between the cuff and your arm. Position the cord so that it rests in the bend of your  elbow. Press the power button. Sit quietly while the cuff inflates and deflates. Read the digital reading on the monitor screen and write the numbers down (record them) in a notebook. Wait 2-3 minutes, then repeat the steps, starting at step 1. What does my blood pressure reading mean? A blood pressure reading consists of a higher number over a lower number. Ideally, your blood pressure should be below 120/80. The first ("top") number is called the systolic pressure. It is a measure of the pressure in your arteries as your heart beats. The second ("bottom") number is called the diastolic pressure. It is a measure of the pressure in your arteries as the heart relaxes. Blood pressure is classified into four stages. The following are the stages for adults who do not have a short-term serious illness or a chronic condition. Systolic pressure and diastolic pressure are measured in a unit called mm Hg (millimeters of mercury).  Normal Systolic pressure: below 123456. Diastolic pressure: below 80. Elevated Systolic pressure: [id number]. Diastolic pressure: below 80. Hypertension stage 1 Systolic pressure: 0000000. Diastolic pressure: [id number]. Hypertension stage 2 Systolic pressure: XX123456 or above. Diastolic pressure: 90 or above. You can have elevated blood pressure or hypertension even if only the systolic or only the diastolic number in your reading is higher than normal. Follow these instructions at home: Medicines Take over-the-counter and prescription medicines only as told by your health care provider. Tell your health care provider if you are having any  side effects from blood pressure medicine. General instructions Check your blood pressure as often as recommended by your health care provider. Check your blood pressure at the same time every day. Take your monitor to the next appointment with your health care provider to make sure that: You are using it correctly. It provides accurate  readings. Understand what your goal blood pressure numbers are. Keep all follow-up visits. This is important. General tips Your health care provider can suggest a reliable monitor that will meet your needs. There are several types of home blood pressure monitors. Choose a monitor that has an arm cuff. Do not choose a monitor that measures your blood pressure from your wrist or finger. Choose a cuff that wraps snugly, not too tight or too loose, around your upper arm. You should be able to fit only one finger between your arm and the cuff. You can buy a blood pressure monitor at most drugstores or online. Where to find more information American Heart Association: www.heart.org Contact a health care provider if: Your blood pressure is consistently high. Your blood pressure is suddenly low. Get help right away if: Your systolic blood pressure is higher than 180. Your diastolic blood pressure is higher than 120. These symptoms may be an emergency. Get help right away. Call 911. Do not wait to see if the symptoms will go away. Do not drive yourself to the hospital. Summary Blood pressure is a measurement of how strongly your blood is pressing against the walls of your arteries. A blood pressure reading consists of a higher number over a lower number. Ideally, your blood pressure should be below 120/80. Check your blood pressure at the same time every day. Avoid caffeine, alcohol, smoking, and exercise for 30 minutes prior to checking your blood pressure. These agents can affect the accuracy of the blood pressure reading. This information is not intended to replace advice given to you by your health care provider. Make sure you discuss any questions you have with your health care provider. Document Revised: 03/03/2021 Document Reviewed: 03/03/2021 Elsevier Patient Education  [place] Eating Plan DASH stands for Dietary Approaches to Stop Hypertension. The DASH eating plan is a  healthy eating plan that has been shown to: Reduce high blood pressure (hypertension). Reduce your risk for type 2 diabetes, heart disease, and stroke. Help with weight loss. What are tips for following this plan? Reading food labels Check food labels for the amount of salt (sodium) per serving. Choose foods with less than 5 percent of the Daily Value of sodium. Generally, foods with less than 300 milligrams (mg) of sodium per serving fit into this eating plan. To find whole grains, look for the word "whole" as the first word in the ingredient list. Shopping Buy products labeled as "low-sodium" or "no salt added." Buy fresh foods. Avoid canned foods and pre-made or frozen meals. Cooking Avoid adding salt when cooking. Use salt-free seasonings or herbs instead of table salt or sea salt. Check with your health care provider or pharmacist before using salt substitutes. Do not fry foods. Cook foods using healthy methods such as baking, boiling, grilling, roasting, and broiling instead. Cook with heart-healthy oils, such as olive, canola, avocado, soybean, or sunflower oil. Meal planning  Eat a balanced diet that includes: 4 or more servings of fruits and 4 or more servings of vegetables each day. Try to fill one-half of your plate with fruits and vegetables. 6-8 servings of whole grains each day. Less than 6  oz (170 g) of lean meat, poultry, or fish each day. A 3-oz (85-g) serving of meat is about the same size as a deck of cards. One egg equals 1 oz (28 g). 2-3 servings of low-fat dairy each day. One serving is 1 cup (237 mL). 1 serving of nuts, seeds, or beans 5 times each week. 2-3 servings of heart-healthy fats. Healthy fats called omega-3 fatty acids are found in foods such as walnuts, flaxseeds, fortified milks, and eggs. These fats are also found in cold-water fish, such as sardines, salmon, and mackerel. Limit how much you eat of: Canned or prepackaged foods. Food that is high in trans  fat, such as some fried foods. Food that is high in saturated fat, such as fatty meat. Desserts and other sweets, sugary drinks, and other foods with added sugar. Full-fat dairy products. Do not salt foods before eating. Do not eat more than 4 egg yolks a week. Try to eat at least 2 vegetarian meals a week. Eat more home-cooked food and less restaurant, buffet, and fast food. Lifestyle When eating at a restaurant, ask that your food be prepared with less salt or no salt, if possible. If you drink alcohol: Limit how much you use to: 0-1 drink a day for women who are not pregnant. 0-2 drinks a day for men. Be aware of how much alcohol is in your drink. In the [place] one drink equals one 12 oz bottle of beer (355 mL), one 5 oz glass of wine (148 mL), or one 1 oz glass of hard liquor (44 mL). General information Avoid eating more than 2,300 mg of salt a day. If you have hypertension, you may need to reduce your sodium intake to 1,500 mg a day. Work with your health care provider to maintain a healthy body weight or to lose weight. Ask what an ideal weight is for you. Get at least 30 minutes of exercise that causes your heart to beat faster (aerobic exercise) most days of the week. Activities may include walking, swimming, or biking. Work with your health care provider or dietitian to adjust your eating plan to your individual calorie needs. What foods should I eat? Fruits All fresh, dried, or frozen fruit. Canned fruit in natural juice (without added sugar). Vegetables Fresh or frozen vegetables (raw, steamed, roasted, or grilled). Low-sodium or reduced-sodium tomato and vegetable juice. Low-sodium or reduced-sodium tomato sauce and tomato paste. Low-sodium or reduced-sodium canned vegetables. Grains Whole-grain or whole-wheat bread. Whole-grain or whole-wheat pasta. Brown rice. [place]. Bulgur. Whole-grain and low-sodium cereals. Pita bread. Low-fat, low-sodium crackers. Whole-wheat  flour tortillas. Meats and other proteins Skinless chicken or [place]. Ground chicken or [place]. Pork with fat trimmed off. Fish and seafood. Egg whites. Dried beans, peas, or lentils. Unsalted nuts, nut butters, and seeds. Unsalted canned beans. Lean cuts of beef with fat trimmed off. Low-sodium, lean precooked or cured meat, such as sausages or meat loaves. Dairy Low-fat (1%) or fat-free (skim) milk. Reduced-fat, low-fat, or fat-free cheeses. Nonfat, low-sodium ricotta or cottage cheese. Low-fat or nonfat yogurt. Low-fat, low-sodium cheese. Fats and oils Soft margarine without trans fats. Vegetable oil. Reduced-fat, low-fat, or light mayonnaise and salad dressings (reduced-sodium). Canola, safflower, olive, avocado, soybean, and sunflower oils. Avocado. Seasonings and condiments Herbs. Spices. Seasoning mixes without salt. Other foods Unsalted popcorn and pretzels. Fat-free sweets. The items listed above may not be a complete list of foods and beverages you can eat. Contact a dietitian for more information. What foods should I avoid?  Fruits Canned fruit in a light or heavy syrup. Fried fruit. Fruit in cream or butter sauce. Vegetables Creamed or fried vegetables. Vegetables in a cheese sauce. Regular canned vegetables (not low-sodium or reduced-sodium). Regular canned tomato sauce and paste (not low-sodium or reduced-sodium). Regular tomato and vegetable juice (not low-sodium or reduced-sodium). Angie Fava. Olives. Grains Baked goods made with fat, such as croissants, muffins, or some breads. Dry pasta or rice meal packs. Meats and other proteins Fatty cuts of meat. Ribs. Fried meat. Berniece Salines. Bologna, salami, and other precooked or cured meats, such as sausages or meat loaves. Fat from the back of a pig (fatback). Bratwurst. Salted nuts and seeds. Canned beans with added salt. Canned or smoked fish. Whole eggs or egg yolks. Chicken or [place] with skin. Dairy Whole or 2% milk, cream, and  half-and-half. Whole or full-fat cream cheese. Whole-fat or sweetened yogurt. Full-fat cheese. Nondairy creamers. Whipped toppings. Processed cheese and cheese spreads. Fats and oils Butter. Stick margarine. Lard. Shortening. Ghee. Bacon fat. Tropical oils, such as coconut, palm kernel, or palm oil. Seasonings and condiments Onion salt, garlic salt, seasoned salt, table salt, and sea salt. Worcestershire sauce. Tartar sauce. Barbecue sauce. Teriyaki sauce. Soy sauce, including reduced-sodium. Steak sauce. Canned and packaged gravies. Fish sauce. Oyster sauce. Cocktail sauce. Store-bought horseradish. Ketchup. Mustard. Meat flavorings and tenderizers. Bouillon cubes. Hot sauces. Pre-made or packaged marinades. Pre-made or packaged taco seasonings. Relishes. Regular salad dressings. Other foods Salted popcorn and pretzels. The items listed above may not be a complete list of foods and beverages you should avoid. Contact a dietitian for more information. Where to find more information National Heart, Lung, and Blood Institute: https://wilson-eaton.com/ American Heart Association: www.heart.org Academy of Nutrition and Dietetics: www.eatright.[place]: www.kidney.org Summary The DASH eating plan is a healthy eating plan that has been shown to reduce high blood pressure (hypertension). It may also reduce your risk for type 2 diabetes, heart disease, and stroke. When on the DASH eating plan, aim to eat more fresh fruits and vegetables, whole grains, lean proteins, low-fat dairy, and heart-healthy fats. With the DASH eating plan, you should limit salt (sodium) intake to 2,300 mg a day. If you have hypertension, you may need to reduce your sodium intake to 1,500 mg a day. Work with your health care provider or dietitian to adjust your eating plan to your individual calorie needs. This information is not intended to replace advice given to you by your health care provider. Make sure you  discuss any questions you have with your health care provider. Document Revised: 05/23/2019 Document Reviewed: 05/23/2019 Elsevier Patient Education  [place].

## 2022-09-07 NOTE — Progress Notes (Signed)
Date:  09/07/2022   Name:  Amanda Bautista   DOB:  [birth year]   MRN:  [id number]   Chief Complaint: Hypertension (BP started to be elevated yesterday. Bottom number is staying around 100.)  Hypertension This is a chronic problem. The current episode started more than 1 year ago. The problem has been gradually improving since onset. The problem is controlled. Associated symptoms include shortness of breath. Pertinent negatives include no chest pain, orthopnea, palpitations or PND.    Lab Results  Component Value Date   NA 143 08/11/2022   K 4.7 08/11/2022   CO2 25 08/11/2022   GLUCOSE 109 (H) 08/11/2022   BUN 10 08/11/2022   CREATININE 0.70 08/11/2022   CALCIUM 9.7 08/11/2022   EGFR 101 08/11/2022   GFRNONAA 91 12/30/2019   Lab Results  Component Value Date   CHOL 173 08/11/2022   HDL 50 08/11/2022   LDLCALC 99 08/11/2022   TRIG 139 08/11/2022   CHOLHDL 3.5 08/11/2022   Lab Results  Component Value Date   TSH 0.939 08/11/2022   Lab Results  Component Value Date   HGBA1C 6.4 (H) 08/11/2022   Lab Results  Component Value Date   WBC 5.4 08/11/2022   HGB 14.1 08/11/2022   HCT 43.6 08/11/2022   MCV 90 08/11/2022   PLT 286 08/11/2022   Lab Results  Component Value Date   ALT 18 08/11/2022   AST 13 08/11/2022   ALKPHOS 84 08/11/2022   BILITOT 0.3 08/11/2022   No results found for: "25OHVITD2", "25OHVITD3", "VD25OH"   Review of Systems  HENT:  Negative for sneezing and trouble swallowing.   Respiratory:  Positive for shortness of breath. Negative for cough, choking, chest tightness and wheezing.   Cardiovascular:  Negative for chest pain, palpitations, orthopnea, leg swelling and PND.  Gastrointestinal:  Negative for abdominal pain and blood in stool.  Genitourinary:  Negative for difficulty urinating.    Patient Active Problem List   Diagnosis Date Noted   Sprain of ulnar collateral ligament of metacarpophalangeal (MCP) joint of left thumb 02/22/2021   S/P  TAH-BSO 06/27/2020   Xerosis of skin 11/11/2018   Hypertensive retinopathy 05/06/2018   Type II diabetes mellitus with complication ([place]) XX123456   Benign essential HTN 04/15/2018   Hyperlipidemia associated with type 2 diabetes mellitus ([place]) 04/15/2018   Depression, major, recurrent, in partial remission ([place]) 04/15/2018   Generalized anxiety disorder 04/15/2018   Mild intermittent asthma, uncomplicated [id number]    No Known Allergies  Past Surgical History:  Procedure Laterality Date   OOPHORECTOMY     one ovary left   TOTAL ABDOMINAL HYSTERECTOMY      Social History   Tobacco Use   Smoking status: Former    Types: Cigarettes    Quit date: 10/18/2005    Years since quitting: 16.8   Smokeless tobacco: Never  Vaping Use   Vaping Use: Never used  Substance Use Topics   Alcohol use: Never   Drug use: Never     Medication list has been reviewed and updated.  Current Meds  Medication Sig   albuterol (VENTOLIN HFA) 108 (90 Base) MCG/ACT inhaler INHALE 1 TO 2 PUFFS INTO THE LUNGS EVERY 6 HOURS AS NEEDED FOR WHEEZING OR SHORTNESS OF BREATH   ALPRAZolam (XANAX) 0.25 MG tablet TAKE 1 TABLET(0.25 MG) BY MOUTH TWICE DAILY AS NEEDED FOR ANXIETY   APPLE CIDER VINEGAR PO Take by mouth.   Ascorbic Acid (VITAMIN C) 100 MG tablet Take 100  mg by mouth daily.   buPROPion (WELLBUTRIN XL) 300 MG 24 hr tablet TAKE 1 TABLET(300 MG) BY MOUTH DAILY   busPIRone (BUSPAR) 10 MG tablet TAKE 1 TABLET(10 MG) BY MOUTH TWICE DAILY   Elderberry 500 MG CAPS Take by mouth.   escitalopram (LEXAPRO) 20 MG tablet TAKE 1 TABLET(20 MG) BY MOUTH DAILY   fluticasone-salmeterol (ADVAIR DISKUS) 250-50 MCG/ACT AEPB Inhale 1 puff into the lungs in the morning and at bedtime.   lisinopril (ZESTRIL) 10 MG tablet TAKE 1 TABLET(10 MG) BY MOUTH DAILY   metFORMIN (GLUCOPHAGE-XR) 500 MG 24 hr tablet TAKE 1 TABLET(500 MG) BY MOUTH DAILY WITH BREAKFAST   montelukast (SINGULAIR) 10 MG tablet TAKE 1 TABLET(10 MG) BY  MOUTH AT BEDTIME   rosuvastatin (CRESTOR) 10 MG tablet TAKE 1 TABLET(10 MG) BY MOUTH DAILY   triamcinolone cream (KENALOG) 0.1 % Apply 1 application. topically 2 (two) times daily.       09/07/2022    9:11 AM 08/11/2022    9:16 AM 05/04/2022   11:23 AM 08/30/2021    8:06 AM  GAD 7 : Generalized Anxiety Score  Nervous, Anxious, on Edge 0 '2 3 1  '$ Control/stop worrying 0 1 3 0  Worry too much - different things 0 1 3 0  Trouble relaxing 0 1 3 0  Restless 0 1 3 0  Easily annoyed or irritable 0 0 3 1  Afraid - awful might happen 0 1 2 0  Total GAD 7 Score 0 '7 20 2  '$ Anxiety Difficulty Not difficult at all Somewhat difficult Extremely difficult Somewhat difficult       09/07/2022    9:10 AM 08/11/2022    9:15 AM 05/04/2022   11:22 AM  Depression screen PHQ 2/9  Decreased Interest 0 1 0  Down, Depressed, Hopeless 0 1 0  PHQ - 2 Score 0 2 0  Altered sleeping 1 0 0  Tired, decreased energy 1 1 0  Change in appetite 0 1 0  Feeling bad or failure about yourself  0 0 0  Trouble concentrating 0 1 0  Moving slowly or fidgety/restless 0 1 0  Suicidal thoughts 0 0 0  PHQ-9 Score 2 6 0  Difficult doing work/chores Not difficult at all Somewhat difficult Not difficult at all    BP Readings from Last 3 Encounters:  09/07/22 118/78  08/11/22 130/84  05/04/22 128/72    Physical Exam Vitals and nursing note reviewed.  HENT:     Right Ear: Tympanic membrane normal.     Left Ear: Tympanic membrane normal.     Nose: Nose normal. No congestion.     Mouth/Throat:     Mouth: Mucous membranes are moist.     Pharynx: No oropharyngeal exudate.  Eyes:     Conjunctiva/sclera: Conjunctivae normal.  Cardiovascular:     Rate and Rhythm: Regular rhythm.     Heart sounds: Normal heart sounds. No murmur heard.    No gallop.  Pulmonary:     Breath sounds: Normal breath sounds. No wheezing or rhonchi.  Abdominal:     General: Abdomen is flat.     Palpations: Abdomen is soft. There is no hepatomegaly or  splenomegaly.  Neurological:     Deep Tendon Reflexes:     Reflex Scores:      Patellar reflexes are 1+ on the right side and 1+ on the left side.    Wt Readings from Last 3 Encounters:  09/07/22 197 lb (89.4 kg)  08/11/22  202 lb (91.6 kg)  05/04/22 197 lb (89.4 kg)    BP 118/78   Pulse 63   Ht '5\' 5"'$  (1.651 m)   Wt 197 lb (89.4 kg)   SpO2 98%   BMI 32.78 kg/m   Assessment and Plan: 1. Benign essential HTN Chronic.  Controlled.  Stable.  Blood pressure today 118/78.  Asymptomatic.  Tolerating medication well.  Patient currently is taking lisinopril 10 mg once a day.  Patient is also being given a Dash diet for guidelines to use during the day and instructed to take her medication in the morning when it is more physiologically bioavailable and proper to blood pressure taking technique with support to the back feet on the floor waiting 5 minutes proper sized cuff no caffeine..  When patient was measured by the nurse with an elevated blood pressure reading yesterday she had had 2 cups of coffee on the way over and I have a feeling that this may have contributed.    Otilio Miu, MD

## 2022-09-13 ENCOUNTER — Other Ambulatory Visit: Payer: Self-pay | Admitting: Internal Medicine

## 2022-09-13 DIAGNOSIS — F411 Generalized anxiety disorder: Secondary | ICD-10-CM

## 2022-09-14 NOTE — Telephone Encounter (Signed)
Please review.  KP

## 2022-09-14 NOTE — Telephone Encounter (Signed)
Requested medication (s) are due for refill today - yes  Requested medication (s) are on the active medication list -yes  Future visit scheduled -no  Last refill: 05/02/22 #60  Notes to clinic: non delegated Rx  Requested Prescriptions  Pending Prescriptions Disp Refills   ALPRAZolam (XANAX) 0.25 MG tablet [Pharmacy Med Name: ALPRAZOLAM 0.'25MG'$  TABLETS] 60 tablet     Sig: TAKE 1 TABLET(0.25 MG) BY MOUTH TWICE DAILY AS NEEDED FOR ANXIETY     Not Delegated - Psychiatry: Anxiolytics/Hypnotics 2 Failed - 09/13/2022  6:20 PM      Failed - This refill cannot be delegated      Failed - Urine Drug Screen completed in last 360 days      Passed - Patient is not pregnant      Passed - Valid encounter within last 6 months    Recent Outpatient Visits           1 week ago Benign essential HTN   [place] [facility] [place], Amanda C, MD   1 month ago Annual physical exam   [place] at [facility], Jesse Sans, MD   4 months ago Acute non-recurrent maxillary sinusitis   [place] at [facility], Jesse Sans, MD   1 year ago No-show for appointment   [place] at [facility], Jesse Sans, MD   1 year ago Acute non-recurrent maxillary sinusitis   [place] at [facility], Jesse Sans, MD       Future Appointments             In 3 months Army Melia Jesse Sans, MD [place] at [facility], [facility]               Requested Prescriptions  Pending Prescriptions Disp Refills   ALPRAZolam Duanne Moron) 0.25 MG tablet [Pharmacy Med Name: ALPRAZOLAM 0.'25MG'$  TABLETS] 60 tablet     Sig: TAKE 1 TABLET(0.25 MG) BY MOUTH TWICE DAILY AS NEEDED FOR ANXIETY     Not Delegated - Psychiatry: Anxiolytics/Hypnotics 2 Failed - 09/13/2022  6:20 PM      Failed - This  refill cannot be delegated      Failed - Urine Drug Screen completed in last 360 days      Passed - Patient is not pregnant      Passed - Valid encounter within last 6 months    Recent Outpatient Visits           1 week ago Benign essential HTN   [place] at [place], Amanda C, MD   1 month ago Annual physical exam   [place] at [facility], Jesse Sans, MD   4 months ago Acute non-recurrent maxillary sinusitis   [place] at [facility], Jesse Sans, MD   1 year ago No-show for appointment   [place] at [facility], Jesse Sans, MD   1 year ago Acute non-recurrent maxillary sinusitis   [facility] [place] at [facility], Jesse Sans, MD       Future Appointments             In 3 months Army Melia,  Jesse Sans, MD [place] at [facility], [facility]

## 2022-09-15 MED ORDER — ALPRAZOLAM 0.25 MG PO TABS: 0.2500 mg | ORAL_TABLET | Freq: Two times a day (BID) | ORAL | 0 refills | Status: DC | PRN

## 2022-10-17 ENCOUNTER — Ambulatory Visit: Payer: Self-pay | Admitting: *Deleted

## 2022-10-17 ENCOUNTER — Ambulatory Visit (INDEPENDENT_AMBULATORY_CARE_PROVIDER_SITE_OTHER): Payer: BC Managed Care – PPO | Admitting: Internal Medicine

## 2022-10-17 ENCOUNTER — Other Ambulatory Visit: Payer: Self-pay | Admitting: Internal Medicine

## 2022-10-17 VITALS — BP 102/64 | HR 88 | Ht 65.0 in | Wt 202.0 lb

## 2022-10-17 DIAGNOSIS — R1011 Right upper quadrant pain: Secondary | ICD-10-CM | POA: Diagnosis not present

## 2022-10-17 MED ORDER — FAMOTIDINE 40 MG PO TABS
40.0000 mg | ORAL_TABLET | Freq: Every day | ORAL | 0 refills | Status: DC
Start: 2022-10-17 — End: 2022-11-06

## 2022-10-17 NOTE — Telephone Encounter (Signed)
  Chief Complaint: abdominal pain Symptoms: midline upper abdominal pain- comes and goes- moderate Frequency: started Saturday Pertinent Negatives: Patient denies diarrhea, fever, urination pain, vomiting Disposition: ED /[] Urgent Care (no appt availability in office) / Appointment(In office/virtual)/  [place] [facility]/ Home Care/ Refused Recommended Disposition /[] [place] [facility]/  Follow-up with PCP Additional Notes: Appointment scheduled at office for evaluation.

## 2022-10-17 NOTE — Progress Notes (Signed)
Date:  10/17/2022   Name:  Amanda Bautista   DOB:  10-04-1965   MRN:  [id number]   Chief Complaint: Abdominal Pain (Pain is located above belly button. Started on Saturday- 3 days ago. Had 5 Bms Saturday- not diarrhea. Pain is still present today. Its worse when standing and walking. Last BM was yesterday and it was normal.)  Abdominal Pain This is a new problem. Episode onset: three days ago. The onset quality is undetermined. The problem occurs daily. The problem has been waxing and waning. The pain is located in the epigastric region and LUQ. The pain is moderate. The quality of the pain is cramping and colicky. The abdominal pain does not radiate. Associated symptoms include belching and flatus. Pertinent negatives include no constipation, diarrhea (but frequent formed stools), dysuria, fever or vomiting. Nothing aggravates the pain. The pain is relieved by Nothing.    Lab Results  Component Value Date   NA 143 08/11/2022   K 4.7 08/11/2022   CO2 25 08/11/2022   GLUCOSE 109 (H) 08/11/2022   BUN 10 08/11/2022   CREATININE 0.70 08/11/2022   CALCIUM 9.7 08/11/2022   EGFR 101 08/11/2022   GFRNONAA 91 12/30/2019   Lab Results  Component Value Date   CHOL 173 08/11/2022   HDL 50 08/11/2022   LDLCALC 99 08/11/2022   TRIG 139 08/11/2022   CHOLHDL 3.5 08/11/2022   Lab Results  Component Value Date   TSH 0.939 08/11/2022   Lab Results  Component Value Date   HGBA1C 6.4 (H) 08/11/2022   Lab Results  Component Value Date   WBC 5.4 08/11/2022   HGB 14.1 08/11/2022   HCT 43.6 08/11/2022   MCV 90 08/11/2022   PLT 286 08/11/2022   Lab Results  Component Value Date   ALT 18 08/11/2022   AST 13 08/11/2022   ALKPHOS 84 08/11/2022   BILITOT 0.3 08/11/2022   No results found for: "25OHVITD2", "25OHVITD3", "VD25OH"   Review of Systems  Constitutional:  Negative for appetite change, diaphoresis, fatigue and fever.  HENT:  Negative for trouble swallowing.   Cardiovascular:   Negative for chest pain.  Gastrointestinal:  Positive for abdominal pain and flatus. Negative for constipation, diarrhea (but frequent formed stools) and vomiting.       Belching and gas  Genitourinary:  Negative for dysuria.  Musculoskeletal:  Negative for back pain.    Patient Active Problem List   Diagnosis Date Noted   Sprain of ulnar collateral ligament of metacarpophalangeal (MCP) joint of left thumb 02/22/2021   S/P TAH-BSO 06/27/2020   Xerosis of skin 11/11/2018   Hypertensive retinopathy 05/06/2018   Type II diabetes mellitus with complication 04/15/2018   Benign essential HTN 04/15/2018   Hyperlipidemia associated with type 2 diabetes mellitus 04/15/2018   Depression, major, recurrent, 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/15/2018   Generalized anxiety disorder 04/15/2018   Mild intermittent asthma, uncomplicated 04/15/2018    No Known Allergies  Past Surgical History:  Procedure Laterality Date   OOPHORECTOMY     one ovary left   TOTAL ABDOMINAL HYSTERECTOMY      Social History   Tobacco Use   Smoking status: Former    Types: Cigarettes    Quit date: 10/18/2005    Years since quitting: 17.0   Smokeless tobacco: Never  Vaping Use   Vaping Use: Never used  Substance Use Topics   Alcohol use: Never   Drug use: Never     Medication list has been reviewed  and updated.  Current Meds  Medication Sig   albuterol (VENTOLIN HFA) 108 (90 Base) MCG/ACT inhaler INHALE 1 TO 2 PUFFS INTO THE LUNGS EVERY 6 HOURS AS NEEDED FOR WHEEZING OR SHORTNESS OF BREATH   ALPRAZolam (XANAX) 0.25 MG tablet Take 1 tablet (0.25 mg total) by mouth 2 (two) times daily as needed for anxiety.   APPLE CIDER VINEGAR PO Take by mouth.   Ascorbic Acid (VITAMIN C) 100 MG tablet Take 100 mg by mouth daily.   buPROPion (WELLBUTRIN XL) 300 MG 24 hr tablet TAKE 1 TABLET(300 MG) BY MOUTH DAILY   busPIRone (BUSPAR) 10 MG tablet TAKE 1 TABLET(10 MG) BY MOUTH TWICE DAILY   Elderberry 500 MG CAPS Take by mouth.    escitalopram (LEXAPRO) 20 MG tablet TAKE 1 TABLET(20 MG) BY MOUTH DAILY   famotidine (PEPCID) 40 MG tablet Take 1 tablet (40 mg total) by mouth daily.   fluticasone-salmeterol (ADVAIR DISKUS) 250-50 MCG/ACT AEPB Inhale 1 puff into the lungs in the morning and at bedtime.   lisinopril (ZESTRIL) 10 MG tablet TAKE 1 TABLET(10 MG) BY MOUTH DAILY   metFORMIN (GLUCOPHAGE-XR) 500 MG 24 hr tablet TAKE 1 TABLET(500 MG) BY MOUTH DAILY WITH BREAKFAST   montelukast (SINGULAIR) 10 MG tablet TAKE 1 TABLET(10 MG) BY MOUTH AT BEDTIME   rosuvastatin (CRESTOR) 10 MG tablet TAKE 1 TABLET(10 MG) BY MOUTH DAILY   triamcinolone cream (KENALOG) 0.1 % Apply 1 application. topically 2 (two) times daily.       10/17/2022    1:35 PM 09/07/2022    9:11 AM 08/11/2022    9:16 AM 05/04/2022   11:23 AM  GAD 7 : Generalized Anxiety Score  Nervous, Anxious, on Edge 1 0 2 3  Control/stop worrying 0 0 1 3  Worry too much - different things 1 0 1 3  Trouble relaxing 0 0 1 3  Restless 1 0 1 3  Easily annoyed or irritable 0 0 0 3  Afraid - awful might happen 0 0 1 2  Total GAD 7 Score 3 0 7 20  Anxiety Difficulty Not difficult at all Not difficult at all Somewhat difficult Extremely difficult       10/17/2022    1:35 PM 09/07/2022    9:10 AM 08/11/2022    9:15 AM  Depression screen PHQ 2/9  Decreased Interest 0 0 1  Down, Depressed, Hopeless 0 0 1  PHQ - 2 Score 0 0 2  Altered sleeping 1 1 0  Tired, decreased energy Change in appetite 0 0 1  Feeling bad or failure about yourself  0 0 0  Trouble concentrating 0 0 1  Moving slowly or fidgety/restless 0 0 1  Suicidal thoughts 0 0 0  PHQ-9 Score Difficult doing work/chores Not difficult at all Not difficult at all Somewhat difficult    BP Readings from Last 3 Encounters:  10/17/22 102/64  09/07/22 118/78  08/11/22 130/84    Physical Exam Vitals and nursing note reviewed.  Constitutional:      General: She is not in acute distress.    Appearance:  She is well-developed.  HENT:     Head: Normocephalic and atraumatic.  Pulmonary:     Effort: Pulmonary effort is normal. No respiratory distress.  Abdominal:     General: Abdomen is protuberant. Bowel sounds are decreased.     Palpations: Abdomen is soft. There is no hepatomegaly or splenomegaly.     Tenderness: There  is abdominal tenderness in the right upper quadrant and epigastric area. There is no right CVA tenderness, left CVA tenderness, guarding or rebound.  Skin:    General: Skin is warm and dry.     Findings: No rash.  Neurological:     Mental Status: She is alert and oriented to person, place, and time.  Psychiatric:        Mood and Affect: Mood normal.        Behavior: Behavior normal.     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   Partially dictated using Dragon software, any errors are not intentional.  Reubin Milan, MD [facility] [place], [place]

## 2022-10-17 NOTE — Telephone Encounter (Signed)
Reason for Disposition  [1] MODERATE pain (e.g., interferes with normal activities) AND [2] comes and goes (cramps) AND [3] present > 24 hours  (Exception: Pain with Vomiting or Diarrhea - see that Guideline.)  Answer Assessment - Initial Assessment Questions 1. LOCATION: "Where does it hurt?"      Above the bellybutton- midline 2. RADIATION: "Does the pain shoot anywhere else?" (e.g., chest, back)     no 3. ONSET: "When did the pain begin?" (e.g., minutes, hours or days ago)      Over the weekend- Saturday- thought constipation   5. PATTERN "Does the pain come and go, or is it constant?"    - If it comes and goes: "How long does it last?" "Do you have pain now?"     (Note: Comes and goes means the pain is intermittent. It goes away completely between bouts.)    - If constant: "Is it getting better, staying the same, or getting worse?"      (Note: Constant means the pain never goes away completely; most serious pain is constant and gets worse.)      Comes and goes, feels same 6. SEVERITY: "How bad is the pain?"  (e.g., Scale 1-10; mild, moderate, or severe)    - MILD (1-3): Doesn't interfere with normal activities, abdomen soft and not tender to touch.     - MODERATE (4-7): Interferes with normal activities or awakens from sleep, abdomen tender to touch.     - SEVERE (8-10): Excruciating pain, doubled over, unable to do any normal activities.       moderate 7. RECURRENT SYMPTOM: "Have you ever had this type of stomach pain before?" If Yes, ask: "When was the last time?" and "What happened that time?"      no 8. CAUSE: "What do you think is causing the stomach pain?"     unsure 9. RELIEVING/AGGRAVATING FACTORS: "What makes it better or worse?" (e.g., antacids, bending or twisting motion, bowel movement)     Pressure helps 10. OTHER SYMPTOMS: "Do you have any other symptoms?" (e.g., back pain, diarrhea, fever, urination pain, vomiting)       No- chronic back pain, gas  Protocols used:  Abdominal Pain - Female-A-AH, Abdominal Pain - Upper-A-AH

## 2022-10-20 ENCOUNTER — Ambulatory Visit
Admission: RE | Admit: 2022-10-20 | Discharge: 2022-10-20 | Disposition: A | Discharge status: Home / Self Care | Payer: BC Managed Care – PPO | Source: Ambulatory Visit | Attending: Internal Medicine | Admitting: Internal Medicine

## 2022-10-20 DIAGNOSIS — R1011 Right upper quadrant pain: Secondary | ICD-10-CM | POA: Insufficient documentation

## 2022-10-25 ENCOUNTER — Other Ambulatory Visit: Payer: BC Managed Care – PPO

## 2022-10-30 ENCOUNTER — Ambulatory Visit: Payer: BC Managed Care – PPO

## 2022-11-04 ENCOUNTER — Other Ambulatory Visit: Payer: Self-pay | Admitting: Internal Medicine

## 2022-11-04 DIAGNOSIS — J4520 Mild intermittent asthma, uncomplicated: Secondary | ICD-10-CM

## 2022-11-04 DIAGNOSIS — E1169 Type 2 diabetes mellitus with other specified complication: Secondary | ICD-10-CM

## 2022-11-04 DIAGNOSIS — E118 Type 2 diabetes mellitus with unspecified complications: Secondary | ICD-10-CM

## 2022-11-04 DIAGNOSIS — R1011 Right upper quadrant pain: Secondary | ICD-10-CM

## 2022-11-04 DIAGNOSIS — I10 Essential (primary) hypertension: Secondary | ICD-10-CM

## 2022-11-06 NOTE — Telephone Encounter (Signed)
Requested Prescriptions  Pending Prescriptions Disp Refills   montelukast (SINGULAIR) 10 MG tablet [Pharmacy Med Name: MONTELUKAST 10MG  TABLETS] 90 tablet 0    Sig: TAKE 1 TABLET(10 MG) BY MOUTH AT BEDTIME     Pulmonology:  Leukotriene Inhibitors Passed - 11/04/2022  9:02 AM      Passed - Valid encounter within last 12 months    Recent Outpatient Visits           2 weeks ago RUQ abdominal pain   [place] [facility] at [facility], Nyoka Cowden, MD   2 months ago Benign essential HTN   [place] [facility] at [facility]   2 months ago Annual physical exam   [place] [facility] at [facility], Nyoka Cowden, MD   6 months ago Acute non-recurrent maxillary sinusitis   [place] [facility] at [facility], Nyoka Cowden, MD   1 year ago No-show for appointment   [facility] at [facility], Nyoka Cowden, MD       Future Appointments             In 1 month Judithann Graves, Nyoka Cowden, MD [facility] at [facility], [facility]             metFORMIN (GLUCOPHAGE-XR) 500 MG 24 hr tablet [Pharmacy Med Name: METFORMIN ER 500MG  24HR TABS] 90 tablet 0    Sig: TAKE 1 TABLET(500 MG) BY MOUTH DAILY WITH BREAKFAST     Endocrinology:  Diabetes - Biguanides Failed - 11/04/2022  9:02 AM      Failed - B12 Level in normal range and within 720 days    No results found for: "VITAMINB12"       Passed - Cr in normal range and within 360 days    Creatinine, Ser  Date Value Ref Range Status  08/11/2022 0.70 0.57 - 1.00 mg/dL Final         Passed - HBA1C is between 0 and 7.9 and within 180 days    Hgb A1c MFr Bld  Date Value Ref Range Status  08/11/2022 6.4 (H) 4.8 - 5.6 % Final    Comment:             Prediabetes: 5.7 - 6.4          Diabetes: >6.4          Glycemic control for adults with diabetes:  <7.0          Passed - eGFR in normal range and within 360 days    GFR calc Af Amer  Date Value Ref Range Status  12/30/2019 105 >59 mL/min/1.73 Final    Comment:    **[facility] currently reports eGFR in compliance with the current**   recommendations of the [employer]. [facility] will   update reporting as new guidelines are published from the NKF-ASN   Task force.    GFR calc non Af Amer  Date Value Ref Range Status  12/30/2019 91 >59 mL/min/1.73 Final   eGFR  Date Value Ref Range Status  08/11/2022 101 >59 mL/min/1.73 Final         Passed - Valid encounter within last 6 months    Recent Outpatient Visits           2 weeks ago RUQ abdominal pain    Primary  Care & Sports Medicine at [facility], Nyoka Cowden, MD   2 months ago Benign essential HTN   [place] [facility] at [facility], [facility]   2 months ago Annual physical exam   [facility] at [facility], Nyoka Cowden, MD   6 months ago Acute non-recurrent maxillary sinusitis   [place] [facility] at [facility], Nyoka Cowden, MD   1 year ago No-show for appointment   [facility] at [facility], Nyoka Cowden, MD       Future Appointments             In 1 month Judithann Graves Nyoka Cowden, MD [facility] Care & [facility] Medicine at [facility], [facility]            Passed - CBC within normal limits and completed in the last 12 months    WBC  Date Value Ref Range Status  08/11/2022 5.4 3.4 - 10.8 x10E3/uL Final   RBC  Date Value Ref Range Status  08/11/2022 4.87 3.77 - 5.28 x10E6/uL Final  11/16/2020 5 4.04 - 5.48 M/uL Final   Hemoglobin  Date Value Ref Range Status  08/11/2022 14.1 11.1 - 15.9 g/dL Final   Hematocrit  Date Value Ref Range Status  08/11/2022 43.6 34.0 - 46.6 % Final   MCHC  Date Value Ref Range Status   08/11/2022 32.3 31.5 - 35.7 g/dL Final   [facility]  Date Value Ref Range Status  08/11/2022 29.0 26.6 - 33.0 pg Final   MCV  Date Value Ref Range Status  08/11/2022 90 79 - 97 fL Final   No results found for: "PLTCOUNTKUC", "LABPLAT", "POCPLA" RDW  Date Value Ref Range Status  08/11/2022 13.1 11.7 - 15.4 % Final          lisinopril (ZESTRIL) 10 MG tablet [Pharmacy Med Name: LISINOPRIL 10MG  TABLETS] 90 tablet 0    Sig: TAKE 1 TABLET(10 MG) BY MOUTH DAILY     Cardiovascular:  ACE Inhibitors Passed - 11/04/2022  9:02 AM      Passed - Cr in normal range and within 180 days    Creatinine, Ser  Date Value Ref Range Status  08/11/2022 0.70 0.57 - 1.00 mg/dL Final         Passed - K in normal range and within 180 days    Potassium  Date Value Ref Range Status  08/11/2022 4.7 3.5 - 5.2 mmol/L Final         Passed - Patient is not pregnant      Passed - Last BP in normal range    BP Readings from Last 1 Encounters:  10/17/22 102/64         Passed - Valid encounter within last 6 months    Recent Outpatient Visits           2 weeks ago RUQ abdominal pain   [place] [facility] at [facility], Nyoka Cowden, MD   2 months ago Benign essential HTN   [place] [facility] at [facility], MD   2 months ago Annual physical exam   [facility] at [facility], Nyoka Cowden, MD   6 months ago Acute non-recurrent maxillary sinusitis    Primary Care & [facility] Medicine at  [facility], [facility]   1 year ago No-show for appointment   [facility] at [facility], Nyoka Cowden, MD       Future Appointments             In 1 month Judithann Graves, Nyoka Cowden, MD [facility] at [facility], [facility]             ADVAIR DISKUS 250-50 MCG/ACT Kary Kos Med Name: ADVAIR DISKUS 250/50MCG  (YELLOW) 60] 60 each 0    Sig: INHALE 1 PUFF INTO THE LUNGS IN THE MORNING AND AT BEDTIME     Pulmonology:  Combination Products Passed - 11/04/2022  9:02 AM      Passed - Valid encounter within last 12 months    Recent Outpatient Visits           2 weeks ago RUQ abdominal pain   [place] [facility] at [facility], Nyoka Cowden, MD   2 months ago Benign essential HTN   [place] [facility] at [facility], MD   2 months ago Annual physical exam   [facility] at [facility], Nyoka Cowden, MD   6 months ago Acute non-recurrent maxillary sinusitis   [place] [facility] Medicine at [facility], Nyoka Cowden, MD   1 year ago No-show for appointment   [facility] Medicine at [facility], Nyoka Cowden, MD       Future Appointments             In 1 month Judithann Graves, Nyoka Cowden, MD [facility] at [facility], [facility]             famotidine (PEPCID) 40 MG tablet [Pharmacy Med Name: FAMOTIDINE 40MG  TABLETS] 90 tablet 0    Sig: TAKE 1 TABLET(40 MG) BY MOUTH DAILY     Gastroenterology:  H2 Antagonists Passed - 11/04/2022  9:02 AM      Passed - Valid encounter within last 12 months    Recent Outpatient Visits           2 weeks ago RUQ abdominal pain   [place] [facility] at [facility], Nyoka Cowden, MD   2 months ago Benign essential HTN   [place] [facility] at [facility], MD   2 months ago Annual physical exam   [facility] at [facility], Nyoka Cowden, MD   6 months ago Acute non-recurrent maxillary sinusitis   [place] [facility] Medicine at [facility], Nyoka Cowden, MD   1 year ago No-show for appointment   [facility]  at [facility], Nyoka Cowden, MD       Future Appointments             In 1 month Judithann Graves, Nyoka Cowden, MD [facility] at [facility], [facility]             rosuvastatin (CRESTOR) 10 MG tablet [Pharmacy Med Name: ROSUVASTATIN 10MG  TABLETS] 90 tablet 0    Sig: TAKE 1 TABLET(10 MG) BY MOUTH DAILY     Cardiovascular:  Antilipid - Statins 2 Failed -  11/04/2022  9:02 AM      Failed - Lipid Panel in normal range within the last 12 months    Cholesterol, Total  Date Value Ref Range Status  08/11/2022 173 100 - 199 mg/dL Final   LDL Chol Calc (NIH)  Date Value Ref Range Status  08/11/2022 99 0 - 99 mg/dL Final   HDL  Date Value Ref Range Status  08/11/2022 50 >39 mg/dL Final   Triglycerides  Date Value Ref Range Status  08/11/2022 139 0 - 149 mg/dL Final         Passed - Cr in normal range and within 360 days    Creatinine, Ser  Date Value Ref Range Status  08/11/2022 0.70 0.57 - 1.00 mg/dL Final         Passed - Patient is not pregnant      Passed - Valid encounter within last 12 months    Recent Outpatient Visits           2 weeks ago RUQ abdominal pain   [place] [facility] at [facility], Nyoka Cowden, MD   2 months ago Benign essential HTN   [place] [facility] at [facility], MD   2 months ago Annual physical exam   [facility] at [facility], Nyoka Cowden, MD   6 months ago Acute non-recurrent maxillary sinusitis   [place] [facility] Medicine at [facility], Nyoka Cowden, MD   1 year ago No-show for appointment   [facility] at [facility], Nyoka Cowden, MD       Future Appointments             In 1 month Judithann Graves, Nyoka Cowden, MD [facility] Primary Care & Sports Medicine at [facility], [facility]

## 2022-12-18 ENCOUNTER — Ambulatory Visit: Payer: BC Managed Care – PPO

## 2022-12-24 ENCOUNTER — Encounter: Payer: Self-pay | Admitting: Internal Medicine

## 2022-12-25 ENCOUNTER — Ambulatory Visit: Payer: BC Managed Care – PPO | Admitting: Internal Medicine

## 2022-12-25 ENCOUNTER — Encounter: Payer: Self-pay | Admitting: Internal Medicine

## 2022-12-25 VITALS — BP 122/88 | HR 85 | Ht 65.0 in | Wt 207.0 lb

## 2022-12-25 DIAGNOSIS — E118 Type 2 diabetes mellitus with unspecified complications: Secondary | ICD-10-CM

## 2022-12-25 DIAGNOSIS — Z7984 Long term (current) use of oral hypoglycemic drugs: Secondary | ICD-10-CM

## 2022-12-25 DIAGNOSIS — F3341 Major depressive disorder, recurrent, in partial remission: Secondary | ICD-10-CM | POA: Diagnosis not present

## 2022-12-25 DIAGNOSIS — I10 Essential (primary) hypertension: Secondary | ICD-10-CM | POA: Diagnosis not present

## 2022-12-25 LAB — POCT GLYCOSYLATED HEMOGLOBIN (HGB A1C): Hemoglobin A1C: 6.3 % — AB (ref 4.0–5.6)

## 2022-12-25 NOTE — Assessment & Plan Note (Signed)
Stable exam with well controlled BP.  Currently taking lisinopril. Tolerating medications without concerns or side effects. Will continue to recommend low sodium diet and current regimen.  

## 2022-12-25 NOTE — Assessment & Plan Note (Signed)
Clinically stable on current regimen with good control of symptoms, No SI or HI. No change in management at this time. Continue Buspar and Bupropion.

## 2022-12-25 NOTE — Progress Notes (Signed)
Date:  12/25/2022   Name:  Amanda Bautista   DOB:  1965/12/30   MRN:  [id number]   Chief Complaint: Diabetes and Hypertension  Hypertension This is a chronic problem. The problem is controlled. Pertinent negatives include no chest pain, headaches, palpitations or shortness of breath. Past treatments include ACE inhibitors. There is no history of kidney disease, CAD/MI or CVA.  Diabetes She presents for her follow-up diabetic visit. She has type 2 diabetes mellitus. Her disease course has been stable. Pertinent negatives for hypoglycemia include no headaches or tremors. Pertinent negatives for diabetes include no chest pain, no fatigue, no polydipsia and no polyuria. Pertinent negatives for diabetic complications include no CVA. Current diabetic treatment includes oral agent (monotherapy) (metformin).  Depression        This is a chronic problem.  The problem occurs intermittently (she is doing better now that school is out for the summer).  Associated symptoms include no fatigue, no appetite change and no headaches.  Treatments tried: buspar and bupropion.   Lab Results  Component Value Date   NA 143 08/11/2022   K 4.7 08/11/2022   CO2 25 08/11/2022   GLUCOSE 109 (H) 08/11/2022   BUN 10 08/11/2022   CREATININE 0.70 08/11/2022   CALCIUM 9.7 08/11/2022   EGFR 101 08/11/2022   GFRNONAA 91 12/30/2019   Lab Results  Component Value Date   CHOL 173 08/11/2022   HDL 50 08/11/2022   LDLCALC 99 08/11/2022   TRIG 139 08/11/2022   CHOLHDL 3.5 08/11/2022   Lab Results  Component Value Date   TSH 0.939 08/11/2022   Lab Results  Component Value Date   HGBA1C 6.3 (A) 12/25/2022   Lab Results  Component Value Date   WBC 5.4 08/11/2022   HGB 14.1 08/11/2022   HCT 43.6 08/11/2022   MCV 90 08/11/2022   PLT 286 08/11/2022   Lab Results  Component Value Date   ALT 18 08/11/2022   AST 13 08/11/2022   ALKPHOS 84 08/11/2022   BILITOT 0.3 08/11/2022   No results found for:  "25OHVITD2", "25OHVITD3", "VD25OH"   Review of Systems  Constitutional:  Negative for appetite change, fatigue, fever and unexpected weight change.  HENT:  Negative for tinnitus and trouble swallowing.   Eyes:  Negative for visual disturbance.  Respiratory:  Negative for cough, chest tightness and shortness of breath.   Cardiovascular:  Negative for chest pain, palpitations and leg swelling.  Gastrointestinal:  Negative for abdominal pain.  Endocrine: Negative for polydipsia and polyuria.  Genitourinary:  Negative for dysuria and hematuria.  Musculoskeletal:  Negative for arthralgias.  Neurological:  Negative for tremors, numbness and headaches.  Psychiatric/Behavioral:  Positive for depression. Negative for dysphoric mood.     Patient Active Problem List   Diagnosis Date Noted   Sprain of ulnar collateral ligament of metacarpophalangeal (MCP) joint of left thumb 02/22/2021   S/P TAH-BSO 06/27/2020   Xerosis of skin 11/11/2018   Hypertensive retinopathy 05/06/2018   Type II diabetes mellitus with complication (HCC) 04/15/2018   Benign essential HTN 04/15/2018   Hyperlipidemia associated with type 2 diabetes mellitus (HCC) 04/15/2018   Depression, major, recurrent, in partial remission (HCC) 04/15/2018   Generalized anxiety disorder 04/15/2018   Mild intermittent asthma, uncomplicated 04/15/2018    No Known Allergies  Past Surgical History:  Procedure Laterality Date   OOPHORECTOMY     one ovary left   TOTAL ABDOMINAL HYSTERECTOMY      Social History   Tobacco  Use   Smoking status: Former    Types: Cigarettes    Quit date: 10/18/2005    Years since quitting: 17.1   Smokeless tobacco: Never  Vaping Use   Vaping Use: Never used  Substance Use Topics   Alcohol use: Never   Drug use: Never     Medication list has been reviewed and updated.  Current Meds  Medication Sig   albuterol (VENTOLIN HFA) 108 (90 Base) MCG/ACT inhaler INHALE 1 TO 2 PUFFS INTO THE LUNGS  EVERY 6 HOURS AS NEEDED FOR WHEEZING OR SHORTNESS OF BREATH   ALPRAZolam (XANAX) 0.25 MG tablet Take 1 tablet (0.25 mg total) by mouth 2 (two) times daily as needed for anxiety.   APPLE CIDER VINEGAR PO Take by mouth.   Ascorbic Acid (VITAMIN C) 100 MG tablet Take 100 mg by mouth daily.   buPROPion (WELLBUTRIN XL) 300 MG 24 hr tablet TAKE 1 TABLET(300 MG) BY MOUTH DAILY   busPIRone (BUSPAR) 10 MG tablet TAKE 1 TABLET(10 MG) BY MOUTH TWICE DAILY   Elderberry 500 MG CAPS Take by mouth.   escitalopram (LEXAPRO) 20 MG tablet TAKE 1 TABLET(20 MG) BY MOUTH DAILY   fluticasone-salmeterol (ADVAIR DISKUS) 250-50 MCG/ACT AEPB INHALE 1 PUFF INTO THE LUNGS IN THE MORNING AND AT BEDTIME   lisinopril (ZESTRIL) 10 MG tablet TAKE 1 TABLET(10 MG) BY MOUTH DAILY   metFORMIN (GLUCOPHAGE-XR) 500 MG 24 hr tablet TAKE 1 TABLET(500 MG) BY MOUTH DAILY WITH BREAKFAST   montelukast (SINGULAIR) 10 MG tablet TAKE 1 TABLET(10 MG) BY MOUTH AT BEDTIME   rosuvastatin (CRESTOR) 10 MG tablet TAKE 1 TABLET(10 MG) BY MOUTH DAILY   triamcinolone cream (KENALOG) 0.1 % Apply 1 application. topically 2 (two) times daily.       12/25/2022    9:49 AM 10/17/2022    1:35 PM 09/07/2022    9:11 AM 08/11/2022    9:16 AM  GAD 7 : Generalized Anxiety Score  Nervous, Anxious, on Edge 1 1 0 2  Control/stop worrying 0 0 0 1  Worry too much - different things 1 1 0 1  Trouble relaxing 1 0 0 1  Restless 1 1 0 1  Easily annoyed or irritable 0 0 0 0  Afraid - awful might happen 0 0 0 1  Total GAD 7 Score 4 3 0 7  Anxiety Difficulty Not difficult at all Not difficult at all Not difficult at all Somewhat difficult       12/25/2022    9:49 AM 10/17/2022    1:35 PM 09/07/2022    9:10 AM  Depression screen PHQ 2/9  Decreased Interest 1 0 0  Down, Depressed, Hopeless 1 0 0  PHQ - 2 Score 2 0 0  Altered sleeping 0 1 1  Tired, decreased energy 1 1 1   Change in appetite 1 0 0  Feeling bad or failure about yourself  0 0 0  Trouble concentrating  1 0 0  Moving slowly or fidgety/restless 1 0 0  Suicidal thoughts 0 0 0  PHQ-9 Score 6 2 2   Difficult doing work/chores Somewhat difficult Not difficult at all Not difficult at all    BP Readings from Last 3 Encounters:  12/25/22 122/88  10/17/22 102/64  09/07/22 118/78    Physical Exam Vitals and nursing note reviewed.  Constitutional:      General: She is not in acute distress.    Appearance: She is well-developed.  HENT:     Head: Normocephalic and atraumatic.  Cardiovascular:     Rate and Rhythm: Normal rate and regular rhythm.  Pulmonary:     Effort: Pulmonary effort is normal. No respiratory distress.     Breath sounds: No wheezing or rhonchi.  Musculoskeletal:     Cervical back: Normal range of motion.     Right lower leg: No edema.     Left lower leg: No edema.  Lymphadenopathy:     Cervical: No cervical adenopathy.  Skin:    General: Skin is warm and dry.     Capillary Refill: Capillary refill takes less than 2 seconds.     Findings: No rash.  Neurological:     General: No focal deficit present.     Mental Status: She is alert and oriented to person, place, and time.  Psychiatric:        Mood and Affect: Mood normal.        Behavior: Behavior normal.     Wt Readings from Last 3 Encounters:  12/25/22 207 lb (93.9 kg)  10/17/22 202 lb (91.6 kg)  09/07/22 197 lb (89.4 kg)    BP 122/88   Pulse 85   Ht 5\' 5"  (1.651 m)   Wt 207 lb (93.9 kg)   SpO2 95%   BMI 34.45 kg/m   Assessment and Plan:  Problem List Items Addressed This Visit     Type II diabetes mellitus with complication (HCC) - Primary (Chronic)    Blood sugars stable without hypoglycemic symptoms or events. Currently being treated with metformin. Lab Results  Component Value Date   HGBA1C 6.4 (H) 08/11/2022  A1C today is 6.3. Eye exam is scheduled.      Relevant Orders   POCT glycosylated hemoglobin (Hb A1C) (Completed)   Depression, major, recurrent, in partial remission (HCC)  (Chronic)    Clinically stable on current regimen with good control of symptoms, No SI or HI. No change in management at this time. Continue Buspar and Bupropion.      Benign essential HTN (Chronic)    Stable exam with well controlled BP.  Currently taking lisinopril. Tolerating medications without concerns or side effects. Will continue to recommend low sodium diet and current regimen.       Other Visit Diagnoses     Long term current use of oral hypoglycemic drug           Return in about 4 months (around 04/26/2023) for DM, HTN.   Partially dictated using Dragon software, any errors are not intentional.  Reubin Milan, MD [facility] Medicine [place], [place]

## 2022-12-25 NOTE — Assessment & Plan Note (Addendum)
Blood sugars stable without hypoglycemic symptoms or events. Currently being treated with metformin. Lab Results  Component Value Date   HGBA1C 6.4 (H) 08/11/2022  A1C today is 6.3. Eye exam is scheduled.

## 2023-01-03 ENCOUNTER — Inpatient Hospital Stay: Admission: RE | Admit: 2023-01-03 | Payer: BC Managed Care – PPO | Source: Ambulatory Visit

## 2023-01-11 LAB — HM DIABETES EYE EXAM

## 2023-01-16 ENCOUNTER — Encounter: Payer: Self-pay | Admitting: Internal Medicine

## 2023-01-16 IMAGING — MG MM DIGITAL SCREENING BILAT W/ TOMO AND CAD
8 series · 8 of 24 positions shown · non-contrast
Comparison: Previous exam(s).

CLINICAL DATA: Screening.

EXAM:
DIGITAL SCREENING BILATERAL MAMMOGRAM WITH TOMOSYNTHESIS AND CAD
TECHNIQUE: Bilateral screening digital craniocaudal and mediolateral oblique
mammograms were obtained. Bilateral screening digital breast
tomosynthesis was performed. The images were evaluated with
computer-aided detection.

[L CC synth-2D]
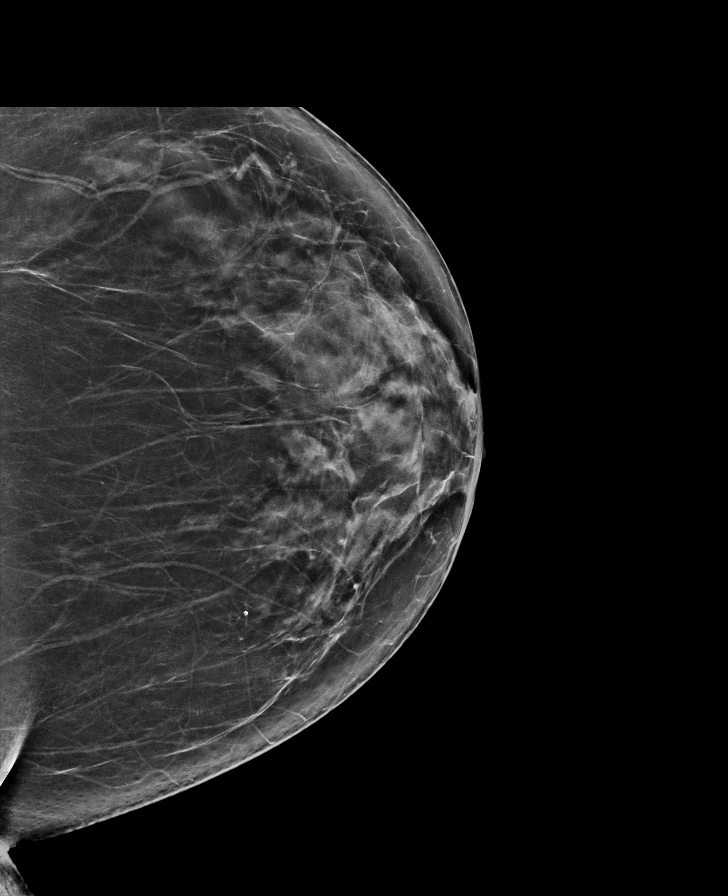

[R MLO synth-2D]
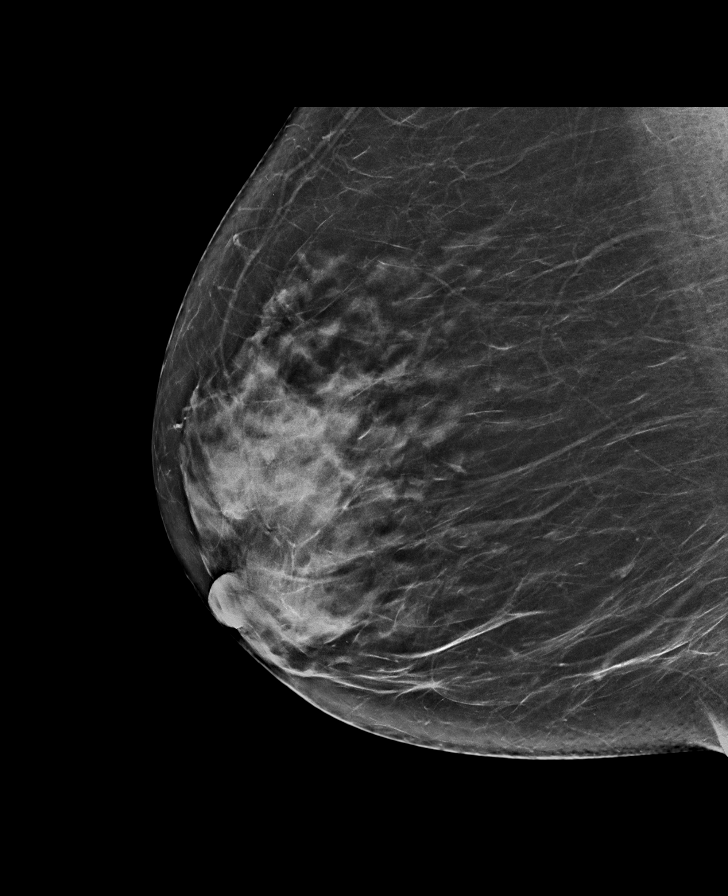

[L MLO synth-2D]
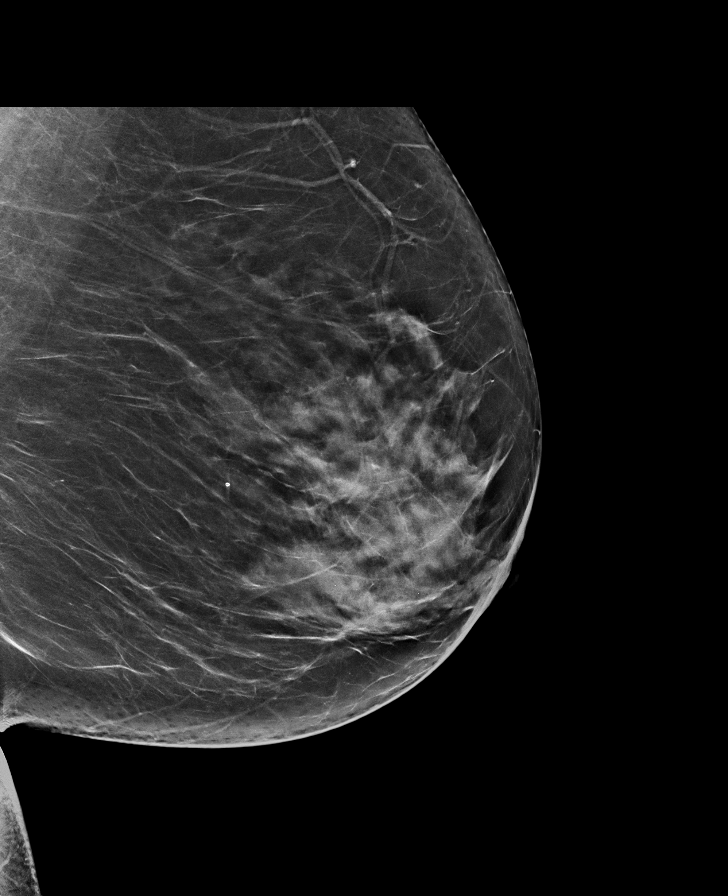

[R CC synth-2D]
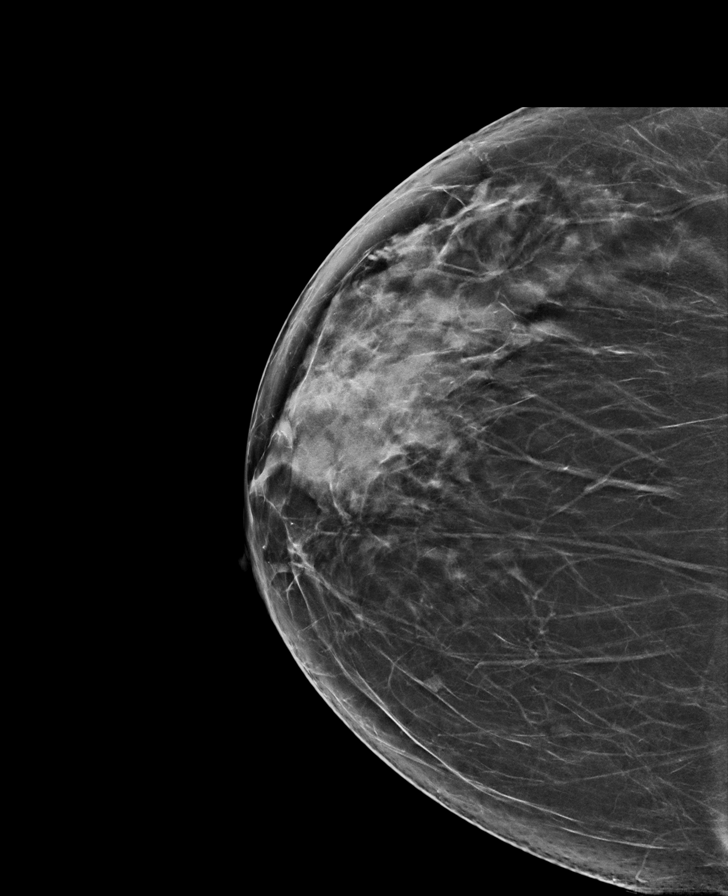

[R CC tomo · tomo slice 36/71.0]
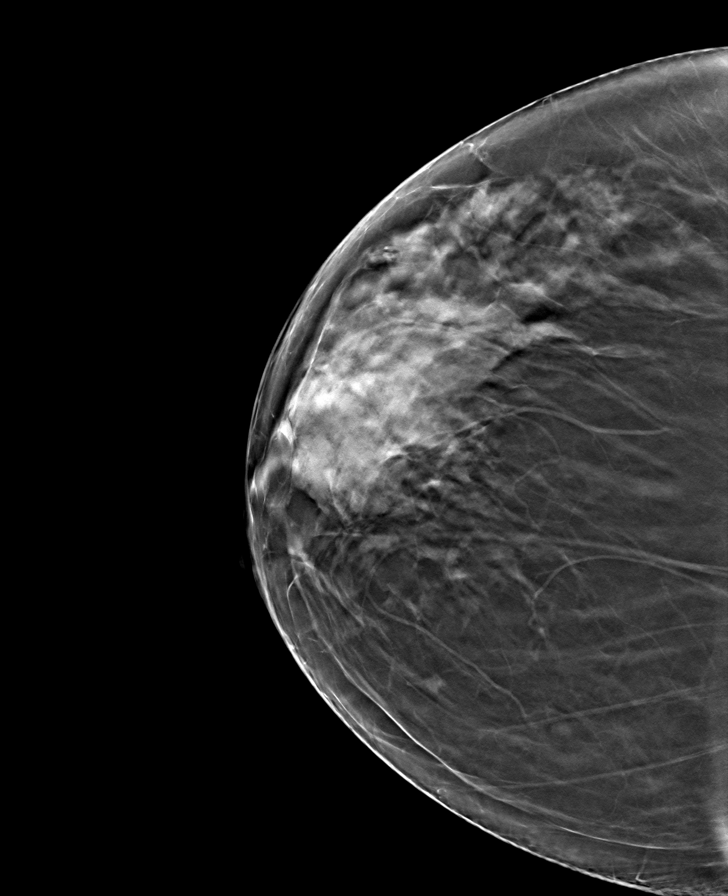

[L MLO tomo · tomo slice 37/74.0]
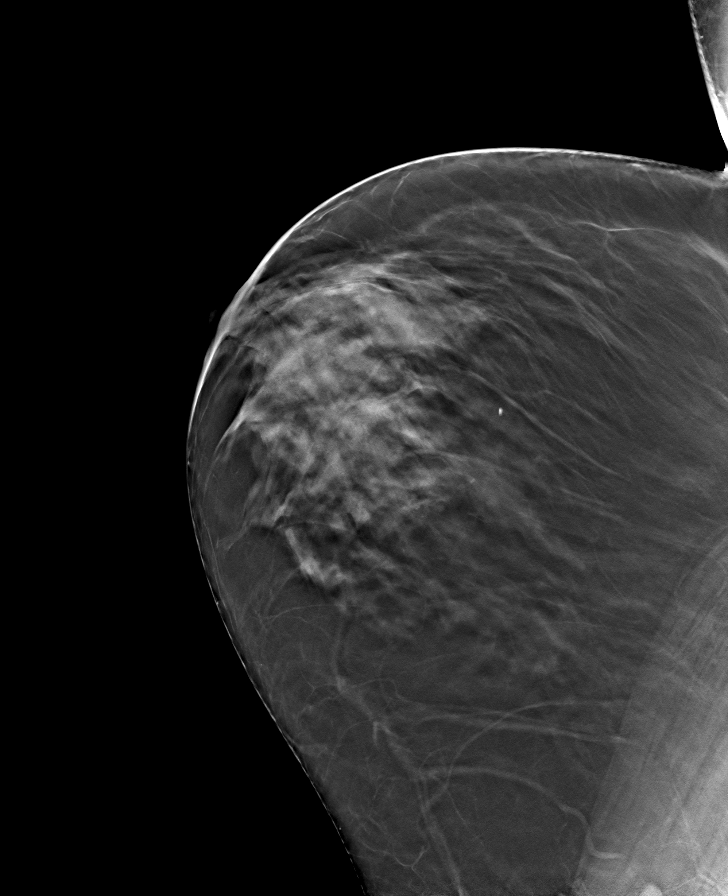

[L CC tomo · tomo slice 39/76.0]
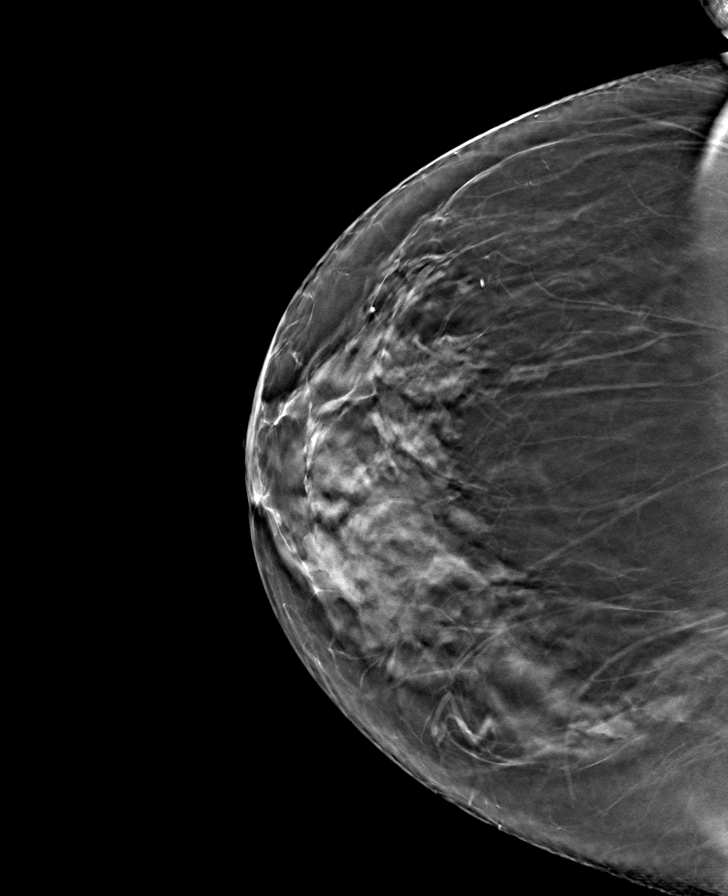

[R MLO tomo · tomo slice 38/75.0]
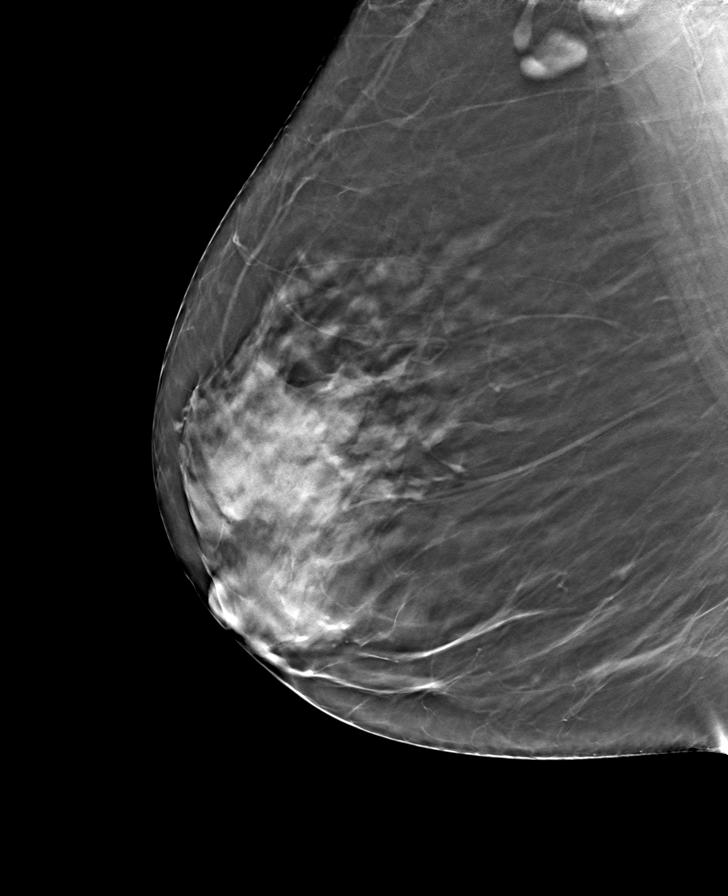

[8 of 24 positions shown; findings below may reference images not displayed]

ACR Breast Density Category c: The breast tissue is heterogeneously
dense, which may obscure small masses.
FINDINGS: There are no findings suspicious for malignancy.
IMPRESSION: No mammographic evidence of malignancy. A result letter of this
screening mammogram will be mailed directly to the patient.

RECOMMENDATION:
Screening mammogram in one year. (Code:[id number])

BI-RADS CATEGORY  1: Negative.

## 2023-01-26 ENCOUNTER — Other Ambulatory Visit: Payer: Self-pay | Admitting: Internal Medicine

## 2023-01-26 DIAGNOSIS — J4520 Mild intermittent asthma, uncomplicated: Secondary | ICD-10-CM

## 2023-01-26 NOTE — Telephone Encounter (Signed)
Requested Prescriptions  Pending Prescriptions Disp Refills   WIXELA INHUB 250-50 MCG/ACT AEPB [Pharmacy Med Name: WIXELA INHUB DISKUS 250/50MCG 60S] 60 each 0    Sig: INHALE 1 PUFF INTO THE LUNGS IN THE MORNING AND AT BEDTIME     Pulmonology:  Combination Products Passed - 01/26/2023  3:31 AM      Passed - Valid encounter within last 12 months    Recent Outpatient Visits           1 month ago Type II diabetes mellitus with complication [facility])   [place] [facility] at [facility], Nyoka Cowden, MD   3 months ago RUQ abdominal pain   [place] [facility] at [facility], Nyoka Cowden, MD   4 months ago Benign essential HTN   [place] [facility] at [facility], MD   5 months ago Annual physical exam   [facility] at [facility], Nyoka Cowden, MD   8 months ago Acute non-recurrent maxillary sinusitis   [facility] Medicine at [facility], Nyoka Cowden, MD       Future Appointments             In 3 months Judithann Graves, Nyoka Cowden, MD [facility] Medicine at [facility], [facility]

## 2023-01-29 ENCOUNTER — Ambulatory Visit
Admission: RE | Admit: 2023-01-29 | Discharge: 2023-01-29 | Disposition: A | Discharge status: Home / Self Care | Payer: BC Managed Care – PPO | Source: Ambulatory Visit | Attending: Internal Medicine | Admitting: Internal Medicine

## 2023-01-29 DIAGNOSIS — Z1231 Encounter for screening mammogram for malignant neoplasm of breast: Secondary | ICD-10-CM | POA: Insufficient documentation

## 2023-02-01 ENCOUNTER — Encounter: Payer: Self-pay | Admitting: Gastroenterology

## 2023-02-08 ENCOUNTER — Encounter
Admission: RE | Disposition: A | Discharge status: Home / Self Care | Payer: Self-pay | Source: Home / Self Care | Attending: Gastroenterology

## 2023-02-08 ENCOUNTER — Ambulatory Visit: Payer: BC Managed Care – PPO | Admitting: Anesthesiology

## 2023-02-08 ENCOUNTER — Encounter: Payer: Self-pay | Admitting: Gastroenterology

## 2023-02-08 ENCOUNTER — Other Ambulatory Visit: Payer: Self-pay

## 2023-02-08 ENCOUNTER — Ambulatory Visit
Admission: RE | Admit: 2023-02-08 | Discharge: 2023-02-08 | Disposition: A | Discharge status: Home / Self Care | Payer: BC Managed Care – PPO | Attending: Gastroenterology | Admitting: Gastroenterology

## 2023-02-08 DIAGNOSIS — E119 Type 2 diabetes mellitus without complications: Secondary | ICD-10-CM | POA: Diagnosis not present

## 2023-02-08 DIAGNOSIS — K635 Polyp of colon: Secondary | ICD-10-CM

## 2023-02-08 DIAGNOSIS — E785 Hyperlipidemia, unspecified: Secondary | ICD-10-CM | POA: Insufficient documentation

## 2023-02-08 DIAGNOSIS — Z79899 Other long term (current) drug therapy: Secondary | ICD-10-CM | POA: Diagnosis not present

## 2023-02-08 DIAGNOSIS — J45909 Unspecified asthma, uncomplicated: Secondary | ICD-10-CM | POA: Insufficient documentation

## 2023-02-08 DIAGNOSIS — Z87891 Personal history of nicotine dependence: Secondary | ICD-10-CM | POA: Diagnosis not present

## 2023-02-08 DIAGNOSIS — Z5986 Financial insecurity: Secondary | ICD-10-CM | POA: Insufficient documentation

## 2023-02-08 DIAGNOSIS — Z1211 Encounter for screening for malignant neoplasm of colon: Secondary | ICD-10-CM | POA: Diagnosis not present

## 2023-02-08 DIAGNOSIS — F324 Major depressive disorder, single episode, in partial remission: Secondary | ICD-10-CM | POA: Insufficient documentation

## 2023-02-08 DIAGNOSIS — Z7984 Long term (current) use of oral hypoglycemic drugs: Secondary | ICD-10-CM | POA: Insufficient documentation

## 2023-02-08 DIAGNOSIS — I10 Essential (primary) hypertension: Secondary | ICD-10-CM | POA: Insufficient documentation

## 2023-02-08 DIAGNOSIS — F419 Anxiety disorder, unspecified: Secondary | ICD-10-CM | POA: Diagnosis not present

## 2023-02-08 DIAGNOSIS — K641 Second degree hemorrhoids: Secondary | ICD-10-CM | POA: Insufficient documentation

## 2023-02-08 HISTORY — DX: Major depressive disorder, single episode, in partial remission: F32.4

## 2023-02-08 HISTORY — PX: COLONOSCOPY WITH PROPOFOL: SHX5780

## 2023-02-08 HISTORY — PX: POLYPECTOMY: SHX5525

## 2023-02-08 HISTORY — DX: Unspecified asthma, uncomplicated: J45.909

## 2023-02-08 LAB — GLUCOSE, CAPILLARY: Glucose-Capillary: 130 mg/dL — ABNORMAL HIGH (ref 70–99)

## 2023-02-08 SURGERY — COLONOSCOPY WITH PROPOFOL
Anesthesia: General | Site: Rectum

## 2023-02-08 MED ORDER — STERILE WATER FOR IRRIGATION IR SOLN
Status: DC | PRN
  Administered 2023-02-08: 1

## 2023-02-08 MED ORDER — PROPOFOL 10 MG/ML IV BOLUS
INTRAVENOUS | Status: DC | PRN
  Administered 2023-02-08: 30 mg via INTRAVENOUS
  Administered 2023-02-08 (×4): 20 mg via INTRAVENOUS
  Administered 2023-02-08: 90 mg via INTRAVENOUS

## 2023-02-08 MED ORDER — LACTATED RINGERS IV SOLN: INTRAVENOUS | Status: DC | PRN

## 2023-02-08 MED ORDER — LIDOCAINE HCL (CARDIAC) PF 100 MG/5ML IV SOSY
PREFILLED_SYRINGE | INTRAVENOUS | Status: DC | PRN
  Administered 2023-02-08: 100 mg via INTRAVENOUS

## 2023-02-08 SURGICAL SUPPLY — 21 items

## 2023-02-08 NOTE — Op Note (Signed)
[facility] Gastroenterology Patient Name: Amanda Bautista Procedure Date: 02/08/2023 8:28 AM MRN: [id number] Account #: [id number] Date of Birth: May 13, 1966 Admit Type: Outpatient Age: 57 Room: [facility] OR ROOM 01 Gender: Female Note Status: Finalized Instrument Name: [id number] Procedure:             Colonoscopy Indications:           Screening for colorectal malignant neoplasm Providers:             Midge Minium MD, MD Referring MD:          Bari Edward, MD (Referring MD) Medicines:             Propofol per Anesthesia Complications:         No immediate complications. Procedure:             Pre-Anesthesia Assessment:                        - Prior to the procedure, a History and Physical was                         performed, and patient medications and allergies were                         reviewed. The patient's tolerance of previous                         anesthesia was also reviewed. The risks and benefits                         of the procedure and the sedation options and risks                         were discussed with the patient. All questions were                         answered, and informed consent was obtained. Prior                         Anticoagulants: The patient has taken no anticoagulant                         or antiplatelet agents. ASA Grade Assessment: II - A                         patient with mild systemic disease. After reviewing                         the risks and benefits, the patient was deemed in                         satisfactory condition to undergo the procedure.                        After obtaining informed consent, the colonoscope was                         passed under direct vision. Throughout the procedure,  the patient's blood pressure, pulse, and oxygen                         saturations were monitored continuously. The                         Colonoscope was introduced through the anus and                          advanced to the the cecum, identified by appendiceal                         orifice and ileocecal valve. The colonoscopy was                         performed without difficulty. The patient tolerated                         the procedure well. The quality of the bowel                         preparation was excellent. Findings:      The perianal and digital rectal examinations were normal.      Three sessile polyps were found in the sigmoid colon. The polyps were 1       to 2 mm in size. These polyps were removed with a cold biopsy forceps.       Resection and retrieval were complete.      Non-bleeding internal hemorrhoids were found during retroflexion. The       hemorrhoids were Grade II (internal hemorrhoids that prolapse but reduce       spontaneously). Impression:            - Three 1 to 2 mm polyps in the sigmoid colon, removed                         with a cold biopsy forceps. Resected and retrieved.                        - Non-bleeding internal hemorrhoids. Recommendation:        - Discharge patient to home.                        - Resume previous diet.                        - Continue present medications.                        - Await pathology results. Procedure Code(s):     --- Professional ---                        [phone], Colonoscopy, flexible; with biopsy, single or                         multiple Diagnosis Code(s):     --- Professional ---                        Z12.11, Encounter for screening for malignant neoplasm  of colon                        D12.5, Benign neoplasm of sigmoid colon CPT copyright 2022 American Medical Association. All rights reserved. The codes documented in this report are preliminary and upon coder review may  be revised to meet current compliance requirements. Midge Minium MD, MD 02/08/2023 8:55:20 AM This report has been signed electronically. Number of Addenda: 0 Note Initiated On: 02/08/2023  8:28 AM Scope Withdrawal Time: 0 hours 9 minutes 18 seconds  Total Procedure Duration: 0 hours 13 minutes 43 seconds  Estimated Blood Loss:  Estimated blood loss: none.      [facility]

## 2023-02-08 NOTE — H&P (Signed)
Midge Minium, MD [facility] [address] [place], [place] [address] Phone: [phone] Fax : [phone]  Primary Care Physician:  Reubin Milan, MD Primary Gastroenterologist:  Dr. Servando Snare  Pre-Procedure History & Physical: HPI:  Amanda Bautista is a 57 y.o. female is here for a screening colonoscopy.   Past Medical History:  Diagnosis Date   Anxiety    Asthma    Depression    Diabetes mellitus without complication (HCC)    Hyperlipidemia    Hypertension    Major depression in partial remission (HCC)     Past Surgical History:  Procedure Laterality Date   OOPHORECTOMY     one ovary left   TOTAL ABDOMINAL HYSTERECTOMY      Prior to Admission medications   Medication Sig Start Date End Date Taking? Authorizing Provider  albuterol (VENTOLIN HFA) 108 (90 Base) MCG/ACT inhaler INHALE 1 TO 2 PUFFS INTO THE LUNGS EVERY 6 HOURS AS NEEDED FOR WHEEZING OR SHORTNESS OF BREATH 05/16/21  Yes Reubin Milan, MD  ALPRAZolam Prudy Feeler) 0.25 MG tablet Take 1 tablet (0.25 mg total) by mouth 2 (two) times daily as needed for anxiety. 09/15/22  Yes Reubin Milan, MD  APPLE CIDER VINEGAR PO Take by mouth.   Yes [provider]  Ascorbic Acid (VITAMIN C) 100 MG tablet Take 100 mg by mouth daily.   Yes [provider]  buPROPion (WELLBUTRIN XL) 300 MG 24 hr tablet TAKE 1 TABLET(300 MG) BY MOUTH DAILY 07/31/22  Yes Reubin Milan, MD  busPIRone (BUSPAR) 10 MG tablet TAKE 1 TABLET(10 MG) BY MOUTH TWICE DAILY 08/29/22  Yes Reubin Milan, MD  Elderberry 500 MG CAPS Take by mouth.   Yes [provider]  escitalopram (LEXAPRO) 20 MG tablet TAKE 1 TABLET(20 MG) BY MOUTH DAILY 08/29/22  Yes Reubin Milan, MD  lisinopril (ZESTRIL) 10 MG tablet TAKE 1 TABLET(10 MG) BY MOUTH DAILY 11/06/22  Yes Reubin Milan, MD  metFORMIN (GLUCOPHAGE-XR) 500 MG 24 hr tablet TAKE 1 TABLET(500 MG) BY MOUTH DAILY WITH BREAKFAST 11/06/22  Yes Reubin Milan, MD  montelukast  (SINGULAIR) 10 MG tablet TAKE 1 TABLET(10 MG) BY MOUTH AT BEDTIME 11/06/22  Yes Reubin Milan, MD  rosuvastatin (CRESTOR) 10 MG tablet TAKE 1 TABLET(10 MG) BY MOUTH DAILY 11/06/22  Yes Reubin Milan, MD  triamcinolone cream (KENALOG) 0.1 % Apply 1 application. topically 2 (two) times daily. 10/16/21  Yes Reubin Milan, MD  Monte Fantasia INHUB 250-50 MCG/ACT AEPB INHALE 1 PUFF INTO THE LUNGS IN THE MORNING AND AT BEDTIME 01/26/23  Yes Reubin Milan, MD    Allergies as of 08/14/2022   (No Known Allergies)    Family History  Problem Relation Age of Onset   Hyperlipidemia Mother    Breast cancer Mother 31   Other Father        Progressive Supranuclear Palsy   Hypertension Father    Hyperlipidemia Father    Diabetes Paternal Grandmother    Breast cancer Maternal Aunt    Breast cancer Maternal Grandmother     Social History   Socioeconomic History   Marital status: Legally Separated    Spouse name: Not on file   Number of children: Not on file   Years of education: Not on file   Highest education level: Master's degree (e.g., MA, MS, MEng, MEd, MSW, MBA)  Occupational History   Not on file  Tobacco Use   Smoking status: Former  Current packs/day: 0.00    Types: Cigarettes    Quit date: 10/18/2005    Years since quitting: 17.3   Smokeless tobacco: Never  Vaping Use   Vaping status: Never Used  Substance and Sexual Activity   Alcohol use: Never   Drug use: Never   Sexual activity: Not on file  Other Topics Concern   Not on file  Social History Narrative   Not on file   Social Determinants of Health   Financial Resource Strain: Medium Risk (10/17/2022)   Overall Financial Resource Strain (CARDIA)    Difficulty of Paying Living Expenses: Somewhat hard  Food Insecurity: Food Insecurity Present (10/17/2022)   Hunger Vital Sign    Worried About Running Out of Food in the Last Year: Sometimes true    Ran Out of Food in the Last Year: Sometimes true  Transportation  Needs: No Transportation Needs (10/17/2022)   PRAPARE - Administrator, Civil Service (Medical): No    Lack of Transportation (Non-Medical): No  Physical Activity: Sufficiently Active (10/17/2022)   Exercise Vital Sign    Days of Exercise per Week: 5 days    Minutes of Exercise per Session: 30 min  Stress: Stress Concern Present (10/17/2022)   [employer] of Occupational Health - Occupational Stress Questionnaire    Feeling of Stress : To some extent  Social Connections: Socially Isolated (10/17/2022)   Social Connection and Isolation Panel [NHANES]    Frequency of Communication with Friends and Family: Never    Frequency of Social Gatherings with Friends and Family: Never    Attends Religious Services: Never    Database administrator or Organizations: No    Attends Engineer, structural: Not on file    Marital Status: Separated  Intimate Partner Violence: Not At Risk (05/04/2022)   Humiliation, Afraid, Rape, and Kick questionnaire    Fear of Current or Ex-Partner: No    Emotionally Abused: No    Physically Abused: No    Sexually Abused: No    Review of Systems: See HPI, otherwise negative ROS  Physical Exam: Ht 5\' 5"  (1.651 m)   Wt 90.7 kg   BMI 33.28 kg/m  General:   Alert,  pleasant and cooperative in NAD Head:  Normocephalic and atraumatic. Neck:  Supple; no masses or thyromegaly. Lungs:  Clear throughout to auscultation.    Heart:  Regular rate and rhythm. Abdomen:  Soft, nontender and nondistended. Normal bowel sounds, without guarding, and without rebound.   Neurologic:  Alert and  oriented x4;  grossly normal neurologically.  Impression/Plan: Amanda Bautista is now here to undergo a screening colonoscopy.  Risks, benefits, and alternatives regarding colonoscopy have been reviewed with the patient.  Questions have been answered.  All parties agreeable.

## 2023-02-08 NOTE — Anesthesia Preprocedure Evaluation (Signed)
Anesthesia Evaluation  Patient identified by MRN, date of birth, ID band Patient awake    Reviewed: Allergy & Precautions, NPO status , Patient's Chart, lab work & pertinent test results  History of Anesthesia Complications Negative for: history of anesthetic complications  Airway Mallampati: III  TM Distance: >3 FB Neck ROM: full    Dental no notable dental hx.    Pulmonary asthma , former smoker   Pulmonary exam normal        Cardiovascular hypertension, On Medications negative cardio ROS Normal cardiovascular exam     Neuro/Psych  PSYCHIATRIC DISORDERS Anxiety Depression    negative neurological ROS     GI/Hepatic negative GI ROS, Neg liver ROS,,,  Endo/Other  negative endocrine ROSdiabetes, Well Controlled, Type 2, Oral Hypoglycemic Agents    Renal/GU negative Renal ROS  negative genitourinary   Musculoskeletal   Abdominal   Peds  Hematology negative hematology ROS (+)   Anesthesia Other Findings Past Medical History: No date: Anxiety No date: Asthma No date: Depression No date: Diabetes mellitus without complication (HCC) No date: Hyperlipidemia No date: Hypertension No date: Major depression in partial remission (HCC)  Past Surgical History: No date: OOPHORECTOMY     Comment:  one ovary left No date: TOTAL ABDOMINAL HYSTERECTOMY  BMI    Body Mass Index: 33.28 kg/m      Reproductive/Obstetrics negative OB ROS                             Anesthesia Physical Anesthesia Plan  ASA: 2  Anesthesia Plan: General   Post-op Pain Management: Minimal or no pain anticipated   Induction: Intravenous  PONV Risk Score and Plan: 2 and Propofol infusion and TIVA  Airway Management Planned: Natural Airway and Nasal Cannula  Additional Equipment:   Intra-op Plan:   Post-operative Plan:   Informed Consent: I have reviewed the patients History and Physical, chart, labs and  discussed the procedure including the risks, benefits and alternatives for the proposed anesthesia with the patient or authorized representative who has indicated his/her understanding and acceptance.     Dental Advisory Given  Plan Discussed with: Anesthesiologist, CRNA and Surgeon  Anesthesia Plan Comments: (Patient consented for risks of anesthesia including but not limited to:  - adverse reactions to medications - risk of airway placement if required - damage to eyes, teeth, lips or other oral mucosa - nerve damage due to positioning  - sore throat or hoarseness - Damage to heart, brain, nerves, lungs, other parts of body or loss of life  Patient voiced understanding.)        Anesthesia Quick Evaluation

## 2023-02-08 NOTE — Anesthesia Postprocedure Evaluation (Signed)
Anesthesia Post Note  Patient: Amanda Bautista  Procedure(s) Performed: COLONOSCOPY WITH PROPOFOL POLYPECTOMY (Rectum)  Patient location during evaluation: PACU Anesthesia Type: General Level of consciousness: awake and alert Pain management: pain level controlled Vital Signs Assessment: post-procedure vital signs reviewed and stable Respiratory status: spontaneous breathing, nonlabored ventilation, respiratory function stable and patient connected to nasal cannula oxygen Cardiovascular status: blood pressure returned to baseline and stable Postop Assessment: no apparent nausea or vomiting Anesthetic complications: no   No notable events documented.   Last Vitals:  Vitals:   02/08/23 0905 02/08/23 0909  BP:  117/78  Pulse: 87 80  Resp: (!) 21 15  Temp:    SpO2: 93% 95%    Last Pain:  Vitals:   02/08/23 0905  TempSrc:   PainSc: 0-No pain                 Louie Boston

## 2023-02-08 NOTE — Transfer of Care (Signed)
Immediate Anesthesia Transfer of Care Note  Patient: Amanda Bautista  Procedure(s) Performed: COLONOSCOPY WITH PROPOFOL POLYPECTOMY (Rectum)  Patient Location: PACU  Anesthesia Type: General  Level of Consciousness: awake, alert  and patient cooperative  Airway and Oxygen Therapy: Patient Spontanous Breathing and Patient connected to supplemental oxygen  Post-op Assessment: Post-op Vital signs reviewed, Patient's Cardiovascular Status Stable, Respiratory Function Stable, Patent Airway and No signs of Nausea or vomiting  Post-op Vital Signs: Reviewed and stable  Complications: No notable events documented.

## 2023-02-14 ENCOUNTER — Encounter: Payer: Self-pay | Admitting: Gastroenterology

## 2023-02-25 ENCOUNTER — Other Ambulatory Visit: Payer: Self-pay | Admitting: Internal Medicine

## 2023-02-25 DIAGNOSIS — F3341 Major depressive disorder, recurrent, in partial remission: Secondary | ICD-10-CM

## 2023-02-25 DIAGNOSIS — J4520 Mild intermittent asthma, uncomplicated: Secondary | ICD-10-CM

## 2023-02-25 DIAGNOSIS — E118 Type 2 diabetes mellitus with unspecified complications: Secondary | ICD-10-CM

## 2023-02-25 DIAGNOSIS — I10 Essential (primary) hypertension: Secondary | ICD-10-CM

## 2023-02-25 DIAGNOSIS — E1169 Type 2 diabetes mellitus with other specified complication: Secondary | ICD-10-CM

## 2023-03-10 ENCOUNTER — Encounter: Payer: Self-pay | Admitting: Internal Medicine

## 2023-03-12 ENCOUNTER — Other Ambulatory Visit: Payer: Self-pay

## 2023-03-12 DIAGNOSIS — J4520 Mild intermittent asthma, uncomplicated: Secondary | ICD-10-CM

## 2023-03-12 MED ORDER — ALBUTEROL SULFATE HFA 108 (90 BASE) MCG/ACT IN AERS
2.0000 | INHALATION_SPRAY | Freq: Four times a day (QID) | RESPIRATORY_TRACT | 3 refills | Status: DC | PRN
Start: 2023-03-12 — End: 2023-06-29

## 2023-04-11 ENCOUNTER — Other Ambulatory Visit: Payer: Self-pay | Admitting: Internal Medicine

## 2023-04-11 DIAGNOSIS — J4520 Mild intermittent asthma, uncomplicated: Secondary | ICD-10-CM

## 2023-04-11 NOTE — Telephone Encounter (Signed)
Requested Prescriptions  Pending Prescriptions Disp Refills   fluticasone-salmeterol (WIXELA INHUB) 250-50 MCG/ACT AEPB [Pharmacy Med Name: WIXELA INHUB DISKUS 250/50MCG 60S] 180 each 0    Sig: INHALE 1 PUFF INTO THE LUNGS IN THE MORNING AND AT BEDTIME     Pulmonology:  Combination Products Passed - 04/11/2023  3:32 AM      Passed - Valid encounter within last 12 months    Recent Outpatient Visits           3 months ago Type II diabetes mellitus with complication [facility])   [place] [facility] at [facility], Nyoka Cowden, MD   5 months ago RUQ abdominal pain   [place] [facility] at [facility], Nyoka Cowden, MD   7 months ago Benign essential HTN   [place] [facility] at [facility]   8 months ago Annual physical exam   [facility] Primary Care & Sports Medicine at [facility], Nyoka Cowden, MD   11 months ago Acute non-recurrent maxillary sinusitis   [facility] Primary Care & Sports Medicine at [facility], Nyoka Cowden, MD       Future Appointments             In 2 weeks Judithann Graves, Nyoka Cowden, MD [facility] Primary Care & Sports Medicine at [facility], [facility]

## 2023-04-19 ENCOUNTER — Other Ambulatory Visit: Payer: Self-pay | Admitting: Internal Medicine

## 2023-04-19 DIAGNOSIS — F411 Generalized anxiety disorder: Secondary | ICD-10-CM

## 2023-04-19 NOTE — Telephone Encounter (Signed)
Please review.  KP

## 2023-04-30 ENCOUNTER — Ambulatory Visit: Payer: BC Managed Care – PPO | Admitting: Internal Medicine

## 2023-05-25 ENCOUNTER — Ambulatory Visit: Payer: BC Managed Care – PPO | Admitting: Internal Medicine

## 2023-05-29 ENCOUNTER — Ambulatory Visit: Payer: BC Managed Care – PPO | Admitting: Internal Medicine

## 2023-06-10 ENCOUNTER — Other Ambulatory Visit: Payer: Self-pay | Admitting: Internal Medicine

## 2023-06-10 DIAGNOSIS — J4520 Mild intermittent asthma, uncomplicated: Secondary | ICD-10-CM

## 2023-06-10 DIAGNOSIS — I10 Essential (primary) hypertension: Secondary | ICD-10-CM

## 2023-06-10 DIAGNOSIS — E118 Type 2 diabetes mellitus with unspecified complications: Secondary | ICD-10-CM

## 2023-06-10 DIAGNOSIS — F3341 Major depressive disorder, recurrent, in partial remission: Secondary | ICD-10-CM

## 2023-06-10 DIAGNOSIS — E1169 Type 2 diabetes mellitus with other specified complication: Secondary | ICD-10-CM

## 2023-06-12 NOTE — Telephone Encounter (Signed)
Requested Prescriptions  Pending Prescriptions Disp Refills   montelukast (SINGULAIR) 10 MG tablet [Pharmacy Med Name: MONTELUKAST 10MG  TABLETS] 90 tablet 0    Sig: TAKE 1 TABLET(10 MG) BY MOUTH AT BEDTIME     Pulmonology:  Leukotriene Inhibitors Passed - 06/10/2023  3:32 AM      Passed - Valid encounter within last 12 months    Recent Outpatient Visits           5 months ago Type II diabetes mellitus with complication [facility])   [place] [facility] Medicine at [facility], Amanda Cowden, MD   7 months ago RUQ abdominal pain   [place] [facility] Medicine at [facility], Amanda Cowden, MD   9 months ago Benign essential HTN   [place] [facility] at [facility], MD   10 months ago Annual physical exam   [facility] Primary Care & Sports Medicine at [facility], Amanda Cowden, MD   1 year ago Acute non-recurrent maxillary sinusitis   [place] [facility] Medicine at [facility], Amanda Cowden, MD       Future Appointments             In 2 weeks Amanda Bautista Amanda Cowden, MD [facility] Primary Care & Sports Medicine at [facility], [facility]             metFORMIN (GLUCOPHAGE-XR) 500 MG 24 hr tablet [Pharmacy Med Name: METFORMIN ER 500MG  24HR TABS] 90 tablet 0    Sig: TAKE 1 TABLET(500 MG) BY MOUTH DAILY WITH BREAKFAST     Endocrinology:  Diabetes - Biguanides Failed - 06/10/2023  3:32 AM      Failed - B12 Level in normal range and within 720 days    No results found for: "VITAMINB12"       Passed - Cr in normal range and within 360 days    Creatinine, Ser  Date Value Ref Range Status  08/11/2022 0.70 0.57 - 1.00 mg/dL Final         Passed - HBA1C is between 0 and 7.9 and within 180 days    Hemoglobin A1C  Date Value Ref Range Status  12/25/2022 6.3 (A) 4.0 - 5.6 % Final   Hgb A1c MFr Bld  Date Value Ref Range Status  08/11/2022 6.4 (H) 4.8 - 5.6 % Final     Comment:             Prediabetes: 5.7 - 6.4          Diabetes: >6.4          Glycemic control for adults with diabetes: <7.0          Passed - eGFR in normal range and within 360 days    GFR calc Af Amer  Date Value Ref Range Status  12/30/2019 105 >59 mL/min/1.73 Final    Comment:    **[facility] currently reports eGFR in compliance with the current**   recommendations of the [employer]. [facility] will   update reporting as new guidelines are published from the NKF-ASN   Task force.    GFR calc non Af Amer  Date Value Ref Range Status  12/30/2019 91 >59 mL/min/1.73 Final   eGFR  Date Value Ref Range Status  08/11/2022 101 >59 mL/min/1.73 Final         Passed - Valid encounter within last 6 months    Recent  Outpatient Visits           5 months ago Type II diabetes mellitus with complication [facility])   [place] [facility] at [facility], Amanda Cowden, MD   7 months ago RUQ abdominal pain   [place] [facility] at [facility], Amanda Cowden, MD   9 months ago Benign essential HTN   [place] [facility] at [facility], MD   10 months ago Annual physical exam   [facility] Primary Care & Sports Medicine at [facility], Amanda Cowden, MD   1 year ago Acute non-recurrent maxillary sinusitis   [place] [facility] Medicine at [facility], Amanda Cowden, MD       Future Appointments             In 2 weeks Amanda Milan, MD [facility] Health Primary Care & Sports Medicine at [facility], [facility]            Passed - CBC within normal limits and completed in the last 12 months    WBC  Date Value Ref Range Status  08/11/2022 5.4 3.4 - 10.8 x10E3/uL Final   RBC  Date Value Ref Range Status  08/11/2022 4.87 3.77 - 5.28 x10E6/uL Final  11/16/2020 5 4.04 - 5.48 M/uL Final   Hemoglobin  Date Value Ref Range Status  08/11/2022 14.1  11.1 - 15.9 g/dL Final   Hematocrit  Date Value Ref Range Status  08/11/2022 43.6 34.0 - 46.6 % Final   MCHC  Date Value Ref Range Status  08/11/2022 32.3 31.5 - 35.7 g/dL Final   [facility]  Date Value Ref Range Status  08/11/2022 29.0 26.6 - 33.0 pg Final   MCV  Date Value Ref Range Status  08/11/2022 90 79 - 97 fL Final   No results found for: "PLTCOUNTKUC", "LABPLAT", "POCPLA" RDW  Date Value Ref Range Status  08/11/2022 13.1 11.7 - 15.4 % Final          lisinopril (ZESTRIL) 10 MG tablet [Pharmacy Med Name: LISINOPRIL 10MG  TABLETS] 90 tablet 0    Sig: TAKE 1 TABLET(10 MG) BY MOUTH DAILY     Cardiovascular:  ACE Inhibitors Failed - 06/10/2023  3:32 AM      Failed - Cr in normal range and within 180 days    Creatinine, Ser  Date Value Ref Range Status  08/11/2022 0.70 0.57 - 1.00 mg/dL Final         Failed - K in normal range and within 180 days    Potassium  Date Value Ref Range Status  08/11/2022 4.7 3.5 - 5.2 mmol/L Final         Passed - Patient is not pregnant      Passed - Last BP in normal range    BP Readings from Last 1 Encounters:  02/08/23 117/78         Passed - Valid encounter within last 6 months    Recent Outpatient Visits           5 months ago Type II diabetes mellitus with complication [facility])   [place] [facility] Medicine at [facility], Amanda Cowden, MD   7 months ago RUQ abdominal pain   [place] [facility] Medicine at [facility], Amanda Cowden, MD   9 months ago Benign essential HTN   [place]  Primary Care & Sports Medicine at [facility], MD   10 months ago Annual physical exam   [facility] Care & Sports Medicine at [facility], Amanda Cowden, MD   1 year ago Acute non-recurrent maxillary sinusitis   [place] [facility] Care [facility] Medicine at [facility], Amanda Cowden, MD       Future Appointments             In 2 weeks Amanda Bautista  Amanda Cowden, MD [facility] Health Primary Care & Sports Medicine at [facility], [facility]             buPROPion (WELLBUTRIN XL) 300 MG 24 hr tablet [Pharmacy Med Name: BUPROPION XL 300MG  TABLETS] 90 tablet 0    Sig: TAKE 1 TABLET(300 MG) BY MOUTH DAILY     Psychiatry: Antidepressants - bupropion Passed - 06/10/2023  3:32 AM      Passed - Cr in normal range and within 360 days    Creatinine, Ser  Date Value Ref Range Status  08/11/2022 0.70 0.57 - 1.00 mg/dL Final         Passed - AST in normal range and within 360 days    AST  Date Value Ref Range Status  08/11/2022 13 0 - 40 IU/L Final         Passed - ALT in normal range and within 360 days    ALT  Date Value Ref Range Status  08/11/2022 18 0 - 32 IU/L Final         Passed - Completed PHQ-2 or PHQ-9 in the last 360 days      Passed - Last BP in normal range    BP Readings from Last 1 Encounters:  02/08/23 117/78         Passed - Valid encounter within last 6 months    Recent Outpatient Visits           5 months ago Type II diabetes mellitus with complication [facility])   [place] [facility] Medicine at [facility], Amanda Cowden, MD   7 months ago RUQ abdominal pain   [place] [facility] Medicine at [facility], Amanda Cowden, MD   9 months ago Benign essential HTN   [place] [facility] at [facility], MD   10 months ago Annual physical exam   [facility] Primary Care & Sports Medicine at [facility], Amanda Cowden, MD   1 year ago Acute non-recurrent maxillary sinusitis   [place] [facility] Care [facility] Sports Medicine at [facility], Amanda Cowden, MD       Future Appointments             In 2 weeks Amanda Milan, MD [facility] Care & [facility] Medicine at [facility], [facility]             rosuvastatin (CRESTOR) 10 MG tablet [Pharmacy Med Name: ROSUVASTATIN 10MG  TABLETS] 90 tablet 0    Sig: TAKE 1 TABLET(10  MG) BY MOUTH DAILY     Cardiovascular:  Antilipid - Statins 2 Failed - 06/10/2023  3:32 AM      Failed - Lipid Panel in normal range within the last 12 months    Cholesterol, Total  Date Value Ref Range Status  08/11/2022 173 100 - 199 mg/dL Final   LDL Chol Calc (NIH)  Date Value Ref Range Status  08/11/2022 99 0 - 99 mg/dL Final   HDL  Date Value Ref Range Status  08/11/2022 50 >39 mg/dL Final   Triglycerides  Date Value Ref Range Status  08/11/2022 139 0 - 149 mg/dL Final         Passed - Cr in normal range and within 360 days    Creatinine, Ser  Date Value Ref Range Status  08/11/2022 0.70 0.57 - 1.00 mg/dL Final         Passed - Patient is not pregnant      Passed - Valid encounter within last 12 months    Recent Outpatient Visits           5 months ago Type II diabetes mellitus with complication [facility])   [place] [facility] Medicine at [facility], Amanda Cowden, MD   7 months ago RUQ abdominal pain   [place] [facility] Medicine at [facility], Amanda Cowden, MD   9 months ago Benign essential HTN   [place] [facility] at [facility], MD   10 months ago Annual physical exam   [facility] Primary Care & Sports Medicine at [facility], Amanda Cowden, MD   1 year ago Acute non-recurrent maxillary sinusitis   [place] [facility] Care [facility] Medicine at [facility], Amanda Cowden, MD       Future Appointments             In 2 weeks Amanda Bautista, Amanda Cowden, MD [facility] Health Primary Care & Sports Medicine at [facility], [facility]

## 2023-06-29 ENCOUNTER — Encounter: Payer: Self-pay | Admitting: Internal Medicine

## 2023-06-29 ENCOUNTER — Ambulatory Visit (INDEPENDENT_AMBULATORY_CARE_PROVIDER_SITE_OTHER): Payer: BC Managed Care – PPO | Admitting: Internal Medicine

## 2023-06-29 VITALS — BP 110/82 | HR 90 | Ht 65.0 in | Wt 207.0 lb

## 2023-06-29 DIAGNOSIS — E1169 Type 2 diabetes mellitus with other specified complication: Secondary | ICD-10-CM | POA: Diagnosis not present

## 2023-06-29 DIAGNOSIS — E118 Type 2 diabetes mellitus with unspecified complications: Secondary | ICD-10-CM

## 2023-06-29 DIAGNOSIS — S86911A Strain of unspecified muscle(s) and tendon(s) at lower leg level, right leg, initial encounter: Secondary | ICD-10-CM

## 2023-06-29 DIAGNOSIS — J4520 Mild intermittent asthma, uncomplicated: Secondary | ICD-10-CM

## 2023-06-29 DIAGNOSIS — I10 Essential (primary) hypertension: Secondary | ICD-10-CM

## 2023-06-29 DIAGNOSIS — E785 Hyperlipidemia, unspecified: Secondary | ICD-10-CM

## 2023-06-29 DIAGNOSIS — Z7984 Long term (current) use of oral hypoglycemic drugs: Secondary | ICD-10-CM

## 2023-06-29 LAB — POCT GLYCOSYLATED HEMOGLOBIN (HGB A1C): Hemoglobin A1C: 6.6 % — AB (ref 4.0–5.6)

## 2023-06-29 MED ORDER — ALBUTEROL SULFATE HFA 108 (90 BASE) MCG/ACT IN AERS: 2.0000 | INHALATION_SPRAY | Freq: Four times a day (QID) | RESPIRATORY_TRACT | 5 refills | Status: AC | PRN

## 2023-06-29 MED ORDER — FLUTICASONE-SALMETEROL 250-50 MCG/ACT IN AEPB
1.0000 | INHALATION_SPRAY | Freq: Two times a day (BID) | RESPIRATORY_TRACT | 5 refills | Status: DC
Start: 2023-06-29 — End: 2023-07-03

## 2023-06-29 NOTE — Assessment & Plan Note (Addendum)
Blood sugars stable without hypoglycemic symptoms or events. Currently managed with metformin alone. Changes made last visit are none. Lab Results  Component Value Date   HGBA1C 6.3 (A) 12/25/2022  A1C today = 6.6

## 2023-06-29 NOTE — Assessment & Plan Note (Signed)
 Controlled BP with normal exam. Current regimen is lisinopril. Will continue same medications; encourage continued reduced sodium diet.

## 2023-06-29 NOTE — Assessment & Plan Note (Signed)
LDL is  Lab Results  Component Value Date   LDLCALC 99 08/11/2022    Current regimen is rosuvastatin 10 mg.  Tolerating medications well without issues.

## 2023-06-29 NOTE — Patient Instructions (Signed)
Diclofenac gel - topical for joints

## 2023-06-29 NOTE — Progress Notes (Signed)
Date:  06/29/2023   Name:  Amanda Bautista   DOB:  12-Mar-1966   MRN:  [id number]   Chief Complaint: No chief complaint on file.  Hypertension This is a chronic problem. The problem is controlled. Pertinent negatives include no chest pain, headaches, palpitations or shortness of breath. Past treatments include ACE inhibitors. The current treatment provides significant improvement.  Diabetes She presents for her follow-up diabetic visit. She has type 2 diabetes mellitus. Pertinent negatives for hypoglycemia include no headaches or tremors. Pertinent negatives for diabetes include no chest pain, no fatigue, no polydipsia and no polyuria. Current diabetic treatment includes oral agent (monotherapy) (metformin). An ACE inhibitor/angiotensin II receptor blocker is being taken.  Hyperlipidemia This is a chronic problem. The problem is controlled. Pertinent negatives include no chest pain or shortness of breath. Current antihyperlipidemic treatment includes statins.  Knee Pain  The incident occurred 5 to 7 days ago. The incident occurred at home. The injury mechanism was a fall. The pain is present in the right knee. The quality of the pain is described as aching. The pain is at a severity of 1/10. The pain is mild. The pain has been Improving since onset. Pertinent negatives include no numbness.    Review of Systems  Constitutional:  Negative for appetite change, fatigue, fever and unexpected weight change.  HENT:  Negative for tinnitus and trouble swallowing.   Eyes:  Negative for visual disturbance.  Respiratory:  Negative for cough, chest tightness and shortness of breath.   Cardiovascular:  Negative for chest pain, palpitations and leg swelling.  Gastrointestinal:  Negative for abdominal pain.  Endocrine: Negative for polydipsia and polyuria.  Genitourinary:  Negative for dysuria and hematuria.  Musculoskeletal:  Positive for arthralgias (right knee pain s/p fall last week).  Neurological:   Negative for tremors, numbness and headaches.  Psychiatric/Behavioral:  Negative for dysphoric mood.      Lab Results  Component Value Date   NA 143 08/11/2022   K 4.7 08/11/2022   CO2 25 08/11/2022   GLUCOSE 109 (H) 08/11/2022   BUN 10 08/11/2022   CREATININE 0.70 08/11/2022   CALCIUM 9.7 08/11/2022   EGFR 101 08/11/2022   GFRNONAA 91 12/30/2019   Lab Results  Component Value Date   CHOL 173 08/11/2022   HDL 50 08/11/2022   LDLCALC 99 08/11/2022   TRIG 139 08/11/2022   CHOLHDL 3.5 08/11/2022   Lab Results  Component Value Date   TSH 0.939 08/11/2022   Lab Results  Component Value Date   HGBA1C 6.6 (A) 06/29/2023   Lab Results  Component Value Date   WBC 5.4 08/11/2022   HGB 14.1 08/11/2022   HCT 43.6 08/11/2022   MCV 90 08/11/2022   PLT 286 08/11/2022   Lab Results  Component Value Date   ALT 18 08/11/2022   AST 13 08/11/2022   ALKPHOS 84 08/11/2022   BILITOT 0.3 08/11/2022   No results found for: "25OHVITD2", "25OHVITD3", "VD25OH"   Patient Active Problem List   Diagnosis Date Noted   Special screening for malignant neoplasms, colon 02/08/2023   Polyp of sigmoid colon 02/08/2023   Sprain of ulnar collateral ligament of metacarpophalangeal (MCP) joint of left thumb 02/22/2021   S/P TAH-BSO 06/27/2020   Xerosis of skin 11/11/2018   Hypertensive retinopathy 05/06/2018   Type II diabetes mellitus with complication (HCC) 04/15/2018   Benign essential HTN 04/15/2018   Hyperlipidemia associated with type 2 diabetes mellitus (HCC) 04/15/2018   Depression, major, recurrent, in  partial remission (HCC) 04/15/2018   Generalized anxiety disorder 04/15/2018   Mild intermittent asthma, uncomplicated 04/15/2018    No Known Allergies  Past Surgical History:  Procedure Laterality Date   COLONOSCOPY WITH PROPOFOL N/A 02/08/2023   Procedure: COLONOSCOPY WITH PROPOFOL;  Surgeon: Midge Minium, MD;  Location[facility];  Service: Endoscopy;  Laterality: N/A;    OOPHORECTOMY     one ovary left   POLYPECTOMY  02/08/2023   Procedure: POLYPECTOMY;  Surgeon: Midge Minium, MD;  Location: [facility];  Service: Endoscopy;;   TOTAL ABDOMINAL HYSTERECTOMY      Social History   Tobacco Use   Smoking status: Former    Current packs/day: 0.00    Types: Cigarettes    Quit date: 10/18/2005    Years since quitting: 17.7   Smokeless tobacco: Never  Vaping Use   Vaping status: Never Used  Substance Use Topics   Alcohol use: Never   Drug use: Never     Medication list has been reviewed and updated.  Current Meds  Medication Sig   ALPRAZolam (XANAX) 0.25 MG tablet TAKE 1 TABLET(0.25 MG) BY MOUTH TWICE DAILY AS NEEDED FOR ANXIETY   APPLE CIDER VINEGAR PO Take by mouth.   Ascorbic Acid (VITAMIN C) 100 MG tablet Take 100 mg by mouth daily.   buPROPion (WELLBUTRIN XL) 300 MG 24 hr tablet TAKE 1 TABLET(300 MG) BY MOUTH DAILY   busPIRone (BUSPAR) 10 MG tablet TAKE 1 TABLET(10 MG) BY MOUTH TWICE DAILY   Elderberry 500 MG CAPS Take by mouth.   escitalopram (LEXAPRO) 20 MG tablet TAKE 1 TABLET(20 MG) BY MOUTH DAILY   ibuprofen (ADVIL) 200 MG tablet Take 200 mg by mouth daily.   lisinopril (ZESTRIL) 10 MG tablet TAKE 1 TABLET(10 MG) BY MOUTH DAILY   metFORMIN (GLUCOPHAGE-XR) 500 MG 24 hr tablet TAKE 1 TABLET(500 MG) BY MOUTH DAILY WITH BREAKFAST   montelukast (SINGULAIR) 10 MG tablet TAKE 1 TABLET(10 MG) BY MOUTH AT BEDTIME   rosuvastatin (CRESTOR) 10 MG tablet TAKE 1 TABLET(10 MG) BY MOUTH DAILY   triamcinolone cream (KENALOG) 0.1 % Apply 1 application. topically 2 (two) times daily.   [DISCONTINUED] albuterol (VENTOLIN HFA) 108 (90 Base) MCG/ACT inhaler Inhale 2 puffs into the lungs every 6 (six) hours as needed for wheezing or shortness of breath.   [DISCONTINUED] fluticasone-salmeterol (WIXELA INHUB) 250-50 MCG/ACT AEPB INHALE 1 PUFF INTO THE LUNGS IN THE MORNING AND AT BEDTIME       06/29/2023    2:30 PM 12/25/2022    9:49 AM 10/17/2022     1:35 PM 09/07/2022    9:11 AM  GAD 7 : Generalized Anxiety Score  Nervous, Anxious, on Edge 2 1 1  0  Control/stop worrying 0 0 0 0  Worry too much - different things 1 1 1  0  Trouble relaxing 1 1 0 0  Restless 1 1 1  0  Easily annoyed or irritable 0 0 0 0  Afraid - awful might happen 0 0 0 0  Total GAD 7 Score 5 4 3  0  Anxiety Difficulty Somewhat difficult Not difficult at all Not difficult at all Not difficult at all       06/29/2023    2:29 PM 12/25/2022    9:49 AM 10/17/2022    1:35 PM  Depression screen PHQ 2/9  Decreased Interest 1 1 0  Down, Depressed, Hopeless 1 1 0  PHQ - 2 Score 2 2 0  Altered sleeping 1 0 1  Tired, decreased energy 1 1 1   Change in appetite 1 1 0  Feeling bad or failure about yourself  1 0 0  Trouble concentrating 1 1 0  Moving slowly or fidgety/restless 1 1 0  Suicidal thoughts 0 0 0  PHQ-9 Score 8 6 2   Difficult doing work/chores Somewhat difficult Somewhat difficult Not difficult at all    BP Readings from Last 3 Encounters:  06/29/23 110/82  02/08/23 117/78  12/25/22 122/88    Physical Exam Vitals and nursing note reviewed.  Constitutional:      General: She is not in acute distress.    Appearance: She is well-developed.  HENT:     Head: Normocephalic and atraumatic.  Cardiovascular:     Rate and Rhythm: Normal rate.  Pulmonary:     Effort: Pulmonary effort is normal. No respiratory distress.     Breath sounds: No wheezing or rhonchi.  Musculoskeletal:     Cervical back: Normal range of motion.     Right knee: No swelling, deformity, effusion or crepitus. Normal range of motion. Tenderness present over the medial joint line.  Lymphadenopathy:     Cervical: No cervical adenopathy.  Skin:    General: Skin is warm and dry.     Findings: No rash.  Neurological:     Mental Status: She is alert and oriented to person, place, and time.     Motor: Motor function is intact.     Gait: Gait is intact.  Psychiatric:        Mood and  Affect: Mood normal.        Behavior: Behavior normal.     Wt Readings from Last 3 Encounters:  06/29/23 207 lb (93.9 kg)  02/08/23 204 lb 9.6 oz (92.8 kg)  12/25/22 207 lb (93.9 kg)    BP 110/82 (BP Location: Right Arm, Patient Position: Sitting, Cuff Size: Large)   Pulse 90   Ht 5\' 5"  (1.651 m)   Wt 207 lb (93.9 kg)   SpO2 97%   BMI 34.45 kg/m   Assessment and Plan:  Problem List Items Addressed This Visit       Unprioritized   Type II diabetes mellitus with complication (HCC) (Chronic)   Blood sugars stable without hypoglycemic symptoms or events. Currently managed with metformin alone. Changes made last visit are none. Lab Results  Component Value Date   HGBA1C 6.3 (A) 12/25/2022  A1C today = 6.6       Relevant Orders   POCT HgB A1C (Completed)   Benign essential HTN - Primary (Chronic)   Controlled BP with normal exam. Current regimen is lisinopril. Will continue same medications; encourage continued reduced sodium diet.       Hyperlipidemia associated with type 2 diabetes mellitus (HCC) (Chronic)   LDL is  Lab Results  Component Value Date   LDLCALC 99 08/11/2022    Current regimen is rosuvastatin 10 mg.  Tolerating medications well without issues.       Mild intermittent asthma, uncomplicated (Chronic)   Relevant Medications   fluticasone-salmeterol (WIXELA INHUB) 250-50 MCG/ACT AEPB   albuterol (VENTOLIN HFA) 108 (90 Base) MCG/ACT inhaler   Other Visit Diagnoses       Knee strain, right, initial encounter       use Advil or topical Diclofenac prn     Long term current use of oral hypoglycemic drug           Return in about 4 months (around 10/28/2023) for CPX.  Reubin Milan, MD [facility] Health Primary Care and Sports Medicine [place]

## 2023-07-03 ENCOUNTER — Other Ambulatory Visit: Payer: Self-pay | Admitting: Internal Medicine

## 2023-07-03 ENCOUNTER — Encounter: Payer: Self-pay | Admitting: Internal Medicine

## 2023-07-03 DIAGNOSIS — F3341 Major depressive disorder, recurrent, in partial remission: Secondary | ICD-10-CM

## 2023-07-03 DIAGNOSIS — I10 Essential (primary) hypertension: Secondary | ICD-10-CM

## 2023-07-03 DIAGNOSIS — E1169 Type 2 diabetes mellitus with other specified complication: Secondary | ICD-10-CM

## 2023-07-03 DIAGNOSIS — F411 Generalized anxiety disorder: Secondary | ICD-10-CM

## 2023-07-03 DIAGNOSIS — E118 Type 2 diabetes mellitus with unspecified complications: Secondary | ICD-10-CM

## 2023-07-03 DIAGNOSIS — J4520 Mild intermittent asthma, uncomplicated: Secondary | ICD-10-CM

## 2023-07-03 MED ORDER — ROSUVASTATIN CALCIUM 10 MG PO TABS: 10.0000 mg | ORAL_TABLET | Freq: Every day | ORAL | 0 refills | Status: DC

## 2023-07-03 MED ORDER — FLUTICASONE-SALMETEROL 250-50 MCG/ACT IN AEPB: 1.0000 | INHALATION_SPRAY | Freq: Two times a day (BID) | RESPIRATORY_TRACT | 5 refills | Status: AC

## 2023-07-03 MED ORDER — LISINOPRIL 10 MG PO TABS: 10.0000 mg | ORAL_TABLET | Freq: Every day | ORAL | 0 refills | Status: DC

## 2023-07-03 MED ORDER — MONTELUKAST SODIUM 10 MG PO TABS: 10.0000 mg | ORAL_TABLET | Freq: Every day | ORAL | 0 refills | Status: DC

## 2023-07-03 MED ORDER — ALPRAZOLAM 0.25 MG PO TABS: 0.2500 mg | ORAL_TABLET | Freq: Every day | ORAL | 0 refills | Status: DC | PRN

## 2023-07-03 MED ORDER — BUPROPION HCL ER (XL) 300 MG PO TB24: 300.0000 mg | ORAL_TABLET | Freq: Every day | ORAL | 0 refills | Status: DC

## 2023-07-03 MED ORDER — ESCITALOPRAM OXALATE 20 MG PO TABS: 20.0000 mg | ORAL_TABLET | Freq: Every day | ORAL | 1 refills | Status: DC

## 2023-07-03 MED ORDER — METFORMIN HCL ER 500 MG PO TB24: 500.0000 mg | ORAL_TABLET | Freq: Every day | ORAL | 0 refills | Status: DC

## 2023-07-03 NOTE — Telephone Encounter (Signed)
Pt response.  KP

## 2023-07-10 ENCOUNTER — Encounter: Payer: Self-pay | Admitting: Internal Medicine

## 2023-08-28 ENCOUNTER — Encounter: Payer: Self-pay | Admitting: Orthopedic Surgery

## 2023-08-28 DIAGNOSIS — M2351 Chronic instability of knee, right knee: Secondary | ICD-10-CM

## 2023-08-28 DIAGNOSIS — M25461 Effusion, right knee: Secondary | ICD-10-CM

## 2023-08-28 DIAGNOSIS — M2391 Unspecified internal derangement of right knee: Secondary | ICD-10-CM

## 2023-08-28 DIAGNOSIS — M25561 Pain in right knee: Secondary | ICD-10-CM

## 2023-08-30 ENCOUNTER — Other Ambulatory Visit: Payer: Self-pay | Admitting: Orthopedic Surgery

## 2023-08-30 DIAGNOSIS — M25461 Effusion, right knee: Secondary | ICD-10-CM

## 2023-08-30 DIAGNOSIS — M2391 Unspecified internal derangement of right knee: Secondary | ICD-10-CM

## 2023-08-30 DIAGNOSIS — M25561 Pain in right knee: Secondary | ICD-10-CM

## 2023-08-30 DIAGNOSIS — M2351 Chronic instability of knee, right knee: Secondary | ICD-10-CM

## 2023-09-07 ENCOUNTER — Ambulatory Visit
Admission: RE | Admit: 2023-09-07 | Discharge: 2023-09-07 | Disposition: A | Discharge status: Home / Self Care | Payer: Self-pay | Source: Ambulatory Visit | Attending: Orthopedic Surgery | Admitting: Orthopedic Surgery

## 2023-09-07 DIAGNOSIS — M2391 Unspecified internal derangement of right knee: Secondary | ICD-10-CM

## 2023-09-07 DIAGNOSIS — M25561 Pain in right knee: Secondary | ICD-10-CM

## 2023-09-07 DIAGNOSIS — M2351 Chronic instability of knee, right knee: Secondary | ICD-10-CM

## 2023-09-07 DIAGNOSIS — M25461 Effusion, right knee: Secondary | ICD-10-CM

## 2023-10-10 ENCOUNTER — Other Ambulatory Visit: Payer: Self-pay | Admitting: Internal Medicine

## 2023-10-10 DIAGNOSIS — E1169 Type 2 diabetes mellitus with other specified complication: Secondary | ICD-10-CM

## 2023-10-10 DIAGNOSIS — E118 Type 2 diabetes mellitus with unspecified complications: Secondary | ICD-10-CM

## 2023-10-10 DIAGNOSIS — I10 Essential (primary) hypertension: Secondary | ICD-10-CM

## 2023-10-10 DIAGNOSIS — E785 Hyperlipidemia, unspecified: Secondary | ICD-10-CM

## 2023-10-10 DIAGNOSIS — F3341 Major depressive disorder, recurrent, in partial remission: Secondary | ICD-10-CM

## 2023-10-11 NOTE — Telephone Encounter (Signed)
 Requested Prescriptions  Pending Prescriptions Disp Refills   metFORMIN (GLUCOPHAGE-XR) 500 MG 24 hr tablet [Pharmacy Med Name: METFORMIN ER 500MG  24HR TABS] 90 tablet 0    Sig: TAKE 1 TABLET BY MOUTH DAILY WITH BREAKFAST     Endocrinology:  Diabetes - Biguanides Failed - 10/11/2023 11:10 AM      Failed - Cr in normal range and within 360 days    Creatinine, Ser  Date Value Ref Range Status  08/11/2022 0.70 0.57 - 1.00 mg/dL Final         Failed - eGFR in normal range and within 360 days    GFR calc Af Amer  Date Value Ref Range Status  12/30/2019 105 >59 mL/min/1.73 Final    Comment:    **[facility] currently reports eGFR in compliance with the current**   recommendations of the [employer]. [facility] will   update reporting as new guidelines are published from the NKF-ASN   Task force.    GFR calc non Af Amer  Date Value Ref Range Status  12/30/2019 91 >59 mL/min/1.73 Final   eGFR  Date Value Ref Range Status  08/11/2022 101 >59 mL/min/1.73 Final         Failed - B12 Level in normal range and within 720 days    No results found for: "VITAMINB12"       Failed - Valid encounter within last 6 months    Recent Outpatient Visits   None     Future Appointments             In 4 weeks Amanda Milan, MD [facility] at [facility], [facility]            Failed - CBC within normal limits and completed in the last 12 months    WBC  Date Value Ref Range Status  08/11/2022 5.4 3.4 - 10.8 x10E3/uL Final   RBC  Date Value Ref Range Status  08/11/2022 4.87 3.77 - 5.28 x10E6/uL Final  11/16/2020 5 4.04 - 5.48 M/uL Final   Hemoglobin  Date Value Ref Range Status  08/11/2022 14.1 11.1 - 15.9 g/dL Final   Hematocrit  Date Value Ref Range Status  08/11/2022 43.6 34.0 - 46.6 % Final   MCHC  Date Value Ref Range Status  08/11/2022 32.3 31.5 - 35.7 g/dL Final   [facility]  Date Value Ref Range Status  08/11/2022 29.0 26.6 - 33.0 pg  Final   MCV  Date Value Ref Range Status  08/11/2022 90 79 - 97 fL Final   No results found for: "PLTCOUNTKUC", "LABPLAT", "POCPLA" RDW  Date Value Ref Range Status  08/11/2022 13.1 11.7 - 15.4 % Final         Passed - HBA1C is between 0 and 7.9 and within 180 days    Hemoglobin A1C  Date Value Ref Range Status  06/29/2023 6.6 (A) 4.0 - 5.6 % Final   Hgb A1c MFr Bld  Date Value Ref Range Status  08/11/2022 6.4 (H) 4.8 - 5.6 % Final    Comment:             Prediabetes: 5.7 - 6.4          Diabetes: >6.4          Glycemic control for adults with diabetes: <7.0           buPROPion (WELLBUTRIN XL) 300 MG 24 hr tablet [Pharmacy Med Name: BUPROPION XL 300MG  TABLETS] 90 tablet 0  Sig: TAKE 1 TABLET BY MOUTH DAILY     Psychiatry: Antidepressants - bupropion Failed - 10/11/2023 11:10 AM      Failed - Cr in normal range and within 360 days    Creatinine, Ser  Date Value Ref Range Status  08/11/2022 0.70 0.57 - 1.00 mg/dL Final         Failed - AST in normal range and within 360 days    AST  Date Value Ref Range Status  08/11/2022 13 0 - 40 IU/L Final         Failed - ALT in normal range and within 360 days    ALT  Date Value Ref Range Status  08/11/2022 18 0 - 32 IU/L Final         Failed - Valid encounter within last 6 months    Recent Outpatient Visits   None     Future Appointments             In 4 weeks Amanda Milan, MD [facility] at [facility], [facility]            Passed - Completed PHQ-2 or PHQ-9 in the last 360 days      Passed - Last BP in normal range    BP Readings from Last 1 Encounters:  06/29/23 110/82          rosuvastatin (CRESTOR) 10 MG tablet [Pharmacy Med Name: ROSUVASTATIN 10MG  TABLETS] 90 tablet 0    Sig: TAKE 1 TABLET BY MOUTH DAILY     Cardiovascular:  Antilipid - Statins 2 Failed - 10/11/2023 11:10 AM      Failed - Cr in normal range and within 360 days    Creatinine, Ser  Date Value Ref  Range Status  08/11/2022 0.70 0.57 - 1.00 mg/dL Final         Failed - Valid encounter within last 12 months    Recent Outpatient Visits   None     Future Appointments             In 4 weeks Amanda Milan, MD [facility] & [facility] Medicine at [facility], [facility]            Failed - Lipid Panel in normal range within the last 12 months    Cholesterol, Total  Date Value Ref Range Status  08/11/2022 173 100 - 199 mg/dL Final   LDL Chol Calc (NIH)  Date Value Ref Range Status  08/11/2022 99 0 - 99 mg/dL Final   HDL  Date Value Ref Range Status  08/11/2022 50 >39 mg/dL Final   Triglycerides  Date Value Ref Range Status  08/11/2022 139 0 - 149 mg/dL Final         Passed - Patient is not pregnant       lisinopril (ZESTRIL) 10 MG tablet [Pharmacy Med Name: LISINOPRIL 10MG  TABLETS] 90 tablet 0    Sig: TAKE 1 TABLET BY MOUTH DAILY     Cardiovascular:  ACE Inhibitors Failed - 10/11/2023 11:10 AM      Failed - Cr in normal range and within 180 days    Creatinine, Ser  Date Value Ref Range Status  08/11/2022 0.70 0.57 - 1.00 mg/dL Final         Failed - K in normal range and within 180 days    Potassium  Date Value Ref Range Status  08/11/2022 4.7 3.5 - 5.2 mmol/L Final  Failed - Valid encounter within last 6 months    Recent Outpatient Visits   None     Future Appointments             In 4 weeks Judithann Graves Nyoka Cowden, MD [facility] & [facility] Medicine at [facility], [facility]            Passed - Patient is not pregnant      Passed - Last BP in normal range    BP Readings from Last 1 Encounters:  06/29/23 110/82          escitalopram (LEXAPRO) 20 MG tablet [Pharmacy Med Name: ESCITALOPRAM 20MG  TABLETS] 90 tablet 1    Sig: TAKE 1 TABLET(20 MG) BY MOUTH DAILY     Psychiatry:  Antidepressants - SSRI Failed - 10/11/2023 11:10 AM      Failed - Valid encounter within last 6 months    Recent Outpatient Visits   None      Future Appointments             In 4 weeks Amanda Milan, MD [facility] Medicine at [facility], [facility]            Passed - Completed PHQ-2 or PHQ-9 in the last 360 days

## 2023-10-31 ENCOUNTER — Other Ambulatory Visit: Payer: Self-pay | Admitting: Internal Medicine

## 2023-10-31 DIAGNOSIS — I10 Essential (primary) hypertension: Secondary | ICD-10-CM

## 2023-10-31 DIAGNOSIS — F411 Generalized anxiety disorder: Secondary | ICD-10-CM

## 2023-10-31 DIAGNOSIS — F3341 Major depressive disorder, recurrent, in partial remission: Secondary | ICD-10-CM

## 2023-10-31 DIAGNOSIS — J4520 Mild intermittent asthma, uncomplicated: Secondary | ICD-10-CM

## 2023-10-31 DIAGNOSIS — E118 Type 2 diabetes mellitus with unspecified complications: Secondary | ICD-10-CM

## 2023-10-31 MED ORDER — LISINOPRIL 10 MG PO TABS: 10.0000 mg | ORAL_TABLET | Freq: Every day | ORAL | 0 refills | Status: DC

## 2023-10-31 MED ORDER — METFORMIN HCL ER 500 MG PO TB24: 500.0000 mg | ORAL_TABLET | Freq: Every day | ORAL | 0 refills | Status: DC

## 2023-10-31 NOTE — Telephone Encounter (Signed)
 Med refill

## 2023-11-01 ENCOUNTER — Encounter: Payer: Self-pay | Admitting: Internal Medicine

## 2023-11-02 ENCOUNTER — Other Ambulatory Visit: Payer: Self-pay

## 2023-11-02 DIAGNOSIS — F411 Generalized anxiety disorder: Secondary | ICD-10-CM

## 2023-11-02 DIAGNOSIS — J4520 Mild intermittent asthma, uncomplicated: Secondary | ICD-10-CM

## 2023-11-02 MED ORDER — ALPRAZOLAM 0.25 MG PO TABS: 0.2500 mg | ORAL_TABLET | Freq: Every day | ORAL | 0 refills | Status: DC | PRN

## 2023-11-02 MED ORDER — MONTELUKAST SODIUM 10 MG PO TABS: 10.0000 mg | ORAL_TABLET | Freq: Every day | ORAL | 0 refills | Status: DC

## 2023-11-03 NOTE — Telephone Encounter (Signed)
 Last OV 06/29/23 Requested Prescriptions  Pending Prescriptions Disp Refills   busPIRone  (BUSPAR ) 10 MG tablet [Pharmacy Med Name: BUSPIRONE  10MG  TABLETS] 180 tablet 1    Sig: TAKE 1 TABLET(10 MG) BY MOUTH TWICE DAILY     Psychiatry: Anxiolytics/Hypnotics - Non-controlled Failed - 11/03/2023  1:18 AM      Failed - Valid encounter within last 12 months    Recent Outpatient Visits   None             montelukast  (SINGULAIR ) 10 MG tablet [Pharmacy Med Name: MONTELUKAST  10MG  TABLETS] 90 tablet 0    Sig: TAKE 1 TABLET BY MOUTH AT BEDTIME     Pulmonology:  Leukotriene Inhibitors Failed - 11/03/2023  1:18 AM      Failed - Valid encounter within last 12 months    Recent Outpatient Visits   None             ALPRAZolam  (XANAX ) 0.25 MG tablet [Pharmacy Med Name: ALPRAZOLAM  0.25MG  TABLETS] 60 tablet     Sig: TAKE 1 TABLET BY MOUTH DAILY AS NEEDED FOR ANXIETY     Not Delegated - Psychiatry: Anxiolytics/Hypnotics 2 Failed - 11/03/2023  1:18 AM      Failed - This refill cannot be delegated      Failed - Urine Drug Screen completed in last 360 days      Failed - Valid encounter within last 6 months    Recent Outpatient Visits   None            Passed - Patient is not pregnant

## 2023-11-05 ENCOUNTER — Telehealth: Payer: Self-pay | Admitting: Internal Medicine

## 2023-11-05 NOTE — Telephone Encounter (Signed)
 Copied from CRM [id number]. Topic: Clinical - Prescription Issue >> Nov 05, 2023  3:37 PM Stanly Early wrote: Reason for CRM: Pt is still at the pharmacy waiting for RX

## 2023-11-05 NOTE — Telephone Encounter (Signed)
 The pt states the pharmacy still does not have any of her prescriptions despite recent notes. She also has spoken with the pharmacy and they still do not have anything for her, she wants to know if you can print these prescriptions for her to come in and take them pharmacy ? Please advise Callback [phone]

## 2023-11-05 NOTE — Telephone Encounter (Addendum)
 Spoke with patient, she informed me that the RX was sent over in her maiden name. The name we had on file was previous last name. She changed her last name up front on 11/05/2023. She went back to [facility] to check for her RX. Will reach out if she has any more issues.

## 2023-11-06 ENCOUNTER — Other Ambulatory Visit: Payer: Self-pay | Admitting: Internal Medicine

## 2023-11-06 ENCOUNTER — Other Ambulatory Visit: Payer: Self-pay

## 2023-11-06 DIAGNOSIS — E1169 Type 2 diabetes mellitus with other specified complication: Secondary | ICD-10-CM

## 2023-11-06 MED ORDER — ROSUVASTATIN CALCIUM 10 MG PO TABS: 10.0000 mg | ORAL_TABLET | Freq: Every day | ORAL | 0 refills | Status: DC

## 2023-11-08 ENCOUNTER — Encounter: Payer: Self-pay | Admitting: Internal Medicine

## 2023-11-08 NOTE — Telephone Encounter (Signed)
 Duplicate request, refilled 11/06/23.  Requested Prescriptions  Pending Prescriptions Disp Refills   rosuvastatin  (CRESTOR ) 10 MG tablet [Pharmacy Med Name: ROSUVASTATIN  10MG  TABLETS] 90 tablet 0    Sig: TAKE 1 TABLET BY MOUTH DAILY     Cardiovascular:  Antilipid - Statins 2 Failed - 11/08/2023 10:49 AM      Failed - Cr in normal range and within 360 days    Creatinine, Ser  Date Value Ref Range Status  08/11/2022 0.70 0.57 - 1.00 mg/dL Final         Failed - Valid encounter within last 12 months    Recent Outpatient Visits   None            Failed - Lipid Panel in normal range within the last 12 months    Cholesterol, Total  Date Value Ref Range Status  08/11/2022 173 100 - 199 mg/dL Final   LDL Chol Calc (NIH)  Date Value Ref Range Status  08/11/2022 99 0 - 99 mg/dL Final   HDL  Date Value Ref Range Status  08/11/2022 50 >39 mg/dL Final   Triglycerides  Date Value Ref Range Status  08/11/2022 139 0 - 149 mg/dL Final         Passed - Patient is not pregnant

## 2024-01-08 ENCOUNTER — Other Ambulatory Visit: Payer: Self-pay | Admitting: Internal Medicine

## 2024-01-08 DIAGNOSIS — Z1231 Encounter for screening mammogram for malignant neoplasm of breast: Secondary | ICD-10-CM

## 2024-01-15 ENCOUNTER — Encounter: Admitting: Internal Medicine

## 2024-01-25 ENCOUNTER — Ambulatory Visit (INDEPENDENT_AMBULATORY_CARE_PROVIDER_SITE_OTHER): Admitting: Internal Medicine

## 2024-01-25 ENCOUNTER — Encounter: Payer: Self-pay | Admitting: Internal Medicine

## 2024-01-25 VITALS — BP 118/78 | HR 83 | Ht 65.0 in | Wt 202.6 lb

## 2024-01-25 DIAGNOSIS — Z1231 Encounter for screening mammogram for malignant neoplasm of breast: Secondary | ICD-10-CM | POA: Diagnosis not present

## 2024-01-25 DIAGNOSIS — E1169 Type 2 diabetes mellitus with other specified complication: Secondary | ICD-10-CM | POA: Diagnosis not present

## 2024-01-25 DIAGNOSIS — Z7984 Long term (current) use of oral hypoglycemic drugs: Secondary | ICD-10-CM

## 2024-01-25 DIAGNOSIS — E118 Type 2 diabetes mellitus with unspecified complications: Secondary | ICD-10-CM

## 2024-01-25 DIAGNOSIS — Z0000 Encounter for general adult medical examination without abnormal findings: Secondary | ICD-10-CM | POA: Diagnosis not present

## 2024-01-25 DIAGNOSIS — J4520 Mild intermittent asthma, uncomplicated: Secondary | ICD-10-CM

## 2024-01-25 DIAGNOSIS — I10 Essential (primary) hypertension: Secondary | ICD-10-CM | POA: Diagnosis not present

## 2024-01-25 DIAGNOSIS — E785 Hyperlipidemia, unspecified: Secondary | ICD-10-CM

## 2024-01-25 DIAGNOSIS — Z23 Encounter for immunization: Secondary | ICD-10-CM

## 2024-01-25 DIAGNOSIS — F3341 Major depressive disorder, recurrent, in partial remission: Secondary | ICD-10-CM | POA: Diagnosis not present

## 2024-01-25 NOTE — Assessment & Plan Note (Signed)
 Asthma symptoms well controlled on daily Wixela and PRN albuterol . Recommend Prevnar 20 vaccine.

## 2024-01-25 NOTE — Progress Notes (Signed)
 Date:  01/25/2024   Name:  Amanda Bautista   DOB:  [birth year]   MRN:  [id number]   Chief Complaint: Annual Exam Amanda Bautista is a 58 y.o. female who presents today for her Complete Annual Exam. She feels fairly well. She reports exercising by walking. She reports she is sleeping poorly. Breast complaints - none.  Health Maintenance  Topic Date Due   HIV Screening  Never done   Hepatitis B Vaccine (1 of 3 - 19+ 3-dose series) Never done   COVID-19 Vaccine (6 - 2024-25 season) 03/04/2023   Yearly kidney function blood test for diabetes  08/12/2023   Yearly kidney health urinalysis for diabetes  08/12/2023   Hemoglobin A1C  12/28/2023   Eye exam for diabetics  01/11/2024   Mammogram  01/29/2024   Flu Shot  02/01/2024   Zoster (Shingles) Vaccine (2 of 2) 03/21/2024   Pneumococcal Vaccination (3 of 3 - PCV20 or PCV21) 04/30/2024   Complete foot exam   01/24/2025   DTaP/Tdap/Td vaccine (2 - Td or Tdap) 07/08/2025   Colon Cancer Screening  02/07/2033   Hepatitis C Screening  Completed   HPV Vaccine  Aged Out   Meningitis B Vaccine  Aged Out    Hypertension Pertinent negatives include no chest pain, headaches, palpitations or shortness of breath.  Depression        Associated symptoms include no fatigue, no myalgias and no headaches. Diabetes Pertinent negatives for hypoglycemia include no dizziness or headaches. Pertinent negatives for diabetes include no chest pain, no fatigue and no weakness.  Hyperlipidemia Pertinent negatives include no chest pain, myalgias or shortness of breath.    Review of Systems  Constitutional:  Negative for fatigue and unexpected weight change.  HENT:  Negative for trouble swallowing.   Eyes:  Negative for visual disturbance.  Respiratory:  Negative for cough, chest tightness, shortness of breath and wheezing.   Cardiovascular:  Negative for chest pain, palpitations and leg swelling.  Gastrointestinal:  Negative for abdominal pain, constipation and  diarrhea.  Musculoskeletal:  Negative for arthralgias and myalgias.  Neurological:  Negative for dizziness, weakness, light-headedness and headaches.  Psychiatric/Behavioral:  Positive for depression.      Lab Results  Component Value Date   NA 143 08/11/2022   K 4.7 08/11/2022   CO2 25 08/11/2022   GLUCOSE 109 (H) 08/11/2022   BUN 10 08/11/2022   CREATININE 0.70 08/11/2022   CALCIUM  9.7 08/11/2022   EGFR 101 08/11/2022   GFRNONAA 91 12/30/2019   Lab Results  Component Value Date   CHOL 173 08/11/2022   HDL 50 08/11/2022   LDLCALC 99 08/11/2022   TRIG 139 08/11/2022   CHOLHDL 3.5 08/11/2022   Lab Results  Component Value Date   TSH 0.939 08/11/2022   Lab Results  Component Value Date   HGBA1C 6.6 (A) 06/29/2023   Lab Results  Component Value Date   WBC 5.4 08/11/2022   HGB 14.1 08/11/2022   HCT 43.6 08/11/2022   MCV 90 08/11/2022   PLT 286 08/11/2022   Lab Results  Component Value Date   ALT 18 08/11/2022   AST 13 08/11/2022   ALKPHOS 84 08/11/2022   BILITOT 0.3 08/11/2022   No results found for: MARIEN BOLLS, VD25OH   Patient Active Problem List   Diagnosis Date Noted   Polyp of sigmoid colon 02/08/2023   S/P TAH-BSO 06/27/2020   Xerosis of skin 11/11/2018   Hypertensive retinopathy 05/06/2018   Type II diabetes  mellitus with complication (HCC) 04/15/2018   Benign essential HTN 04/15/2018   Hyperlipidemia associated with type 2 diabetes mellitus (HCC) 04/15/2018   Depression, major, recurrent, in partial remission (HCC) 04/15/2018   Generalized anxiety disorder 04/15/2018   Mild intermittent asthma, uncomplicated 04/15/2018    No Known Allergies  Past Surgical History:  Procedure Laterality Date   COLONOSCOPY WITH PROPOFOL  N/A 02/08/2023   Procedure: COLONOSCOPY WITH PROPOFOL ;  Surgeon: Jinny Carmine, MD;  Location: [facility];  Service: Endoscopy;  Laterality: N/A;   OOPHORECTOMY     one ovary left   POLYPECTOMY  02/08/2023    Procedure: POLYPECTOMY;  Surgeon: Jinny Carmine, MD;  Location: [facility];  Service: Endoscopy;;   TOTAL ABDOMINAL HYSTERECTOMY      Social History   Tobacco Use   Smoking status: Former    Current packs/day: 0.00    Average packs/day: 0.5 packs/day for 10.0 years (5.0 ttl pk-yrs)    Types: Cigarettes    Quit date: 10/18/2005    Years since quitting: 18.2   Smokeless tobacco: Never   Tobacco comments:    On and off depending on stress in life  Vaping Use   Vaping status: Never Used  Substance Use Topics   Alcohol use: Not Currently    Comment: I have not had alcohol in over 10 years   Drug use: Not Currently    Types: Marijuana     Medication list has been reviewed and updated.  Current Meds  Medication Sig   albuterol  (VENTOLIN  HFA) 108 (90 Base) MCG/ACT inhaler Inhale 2 puffs into the lungs every 6 (six) hours as needed for wheezing or shortness of breath.   ALPRAZolam  (XANAX ) 0.25 MG tablet Take 1 tablet (0.25 mg total) by mouth daily as needed for anxiety.   buPROPion  (WELLBUTRIN  XL) 300 MG 24 hr tablet TAKE 1 TABLET BY MOUTH DAILY   busPIRone  (BUSPAR ) 10 MG tablet TAKE 1 TABLET(10 MG) BY MOUTH TWICE DAILY   escitalopram  (LEXAPRO ) 20 MG tablet TAKE 1 TABLET(20 MG) BY MOUTH DAILY   fluticasone -salmeterol (WIXELA INHUB) 250-50 MCG/ACT AEPB Inhale 1 puff into the lungs 2 (two) times daily. in the morning and at bedtime.   lisinopril  (ZESTRIL ) 10 MG tablet Take 1 tablet (10 mg total) by mouth daily.   meloxicam (MOBIC) 15 MG tablet Take 15 mg by mouth daily.   metFORMIN  (GLUCOPHAGE -XR) 500 MG 24 hr tablet Take 1 tablet (500 mg total) by mouth daily with breakfast.   montelukast  (SINGULAIR ) 10 MG tablet Take 1 tablet (10 mg total) by mouth at bedtime.   rosuvastatin  (CRESTOR ) 10 MG tablet Take 1 tablet (10 mg total) by mouth daily.   triamcinolone  cream (KENALOG ) 0.1 % Apply 1 application. topically 2 (two) times daily.   [DISCONTINUED] meloxicam (MOBIC) 7.5 MG  tablet Take 7.5 mg by mouth daily.       01/25/2024    9:40 AM 06/29/2023    2:30 PM 12/25/2022    9:49 AM 10/17/2022    1:35 PM  GAD 7 : Generalized Anxiety Score  Nervous, Anxious, on Edge 2 2 1 1   Control/stop worrying 1 0 0 0  Worry too much - different things 2 1 1 1   Trouble relaxing 1 1 1  0  Restless 2 1 1 1   Easily annoyed or irritable 0 0 0 0  Afraid - awful might happen 0 0 0 0  Total GAD 7 Score 8 5 4 3   Anxiety Difficulty Somewhat difficult Somewhat difficult  Not difficult at all Not difficult at all       01/25/2024    9:39 AM 06/29/2023    2:29 PM 12/25/2022    9:49 AM  Depression screen PHQ 2/9  Decreased Interest 1 1 1   Down, Depressed, Hopeless 2 1 1   PHQ - 2 Score 3 2 2   Altered sleeping 3 1 0  Tired, decreased energy 1 1 1   Change in appetite 2 1 1   Feeling bad or failure about yourself  0 1 0  Trouble concentrating 1 1 1   Moving slowly or fidgety/restless 1 1 1   Suicidal thoughts 0 0 0  PHQ-9 Score 11 8 6   Difficult doing work/chores Somewhat difficult Somewhat difficult Somewhat difficult    BP Readings from Last 3 Encounters:  01/25/24 118/78  06/29/23 110/82  02/08/23 117/78    Physical Exam Vitals and nursing note reviewed.  Constitutional:      General: She is not in acute distress.    Appearance: She is well-developed.  HENT:     Head: Normocephalic and atraumatic.     Right Ear: Tympanic membrane and ear canal normal.     Left Ear: Tympanic membrane and ear canal normal.     Nose:     Right Sinus: No maxillary sinus tenderness.     Left Sinus: No maxillary sinus tenderness.  Eyes:     General: No scleral icterus.       Right eye: No discharge.        Left eye: No discharge.     Conjunctiva/sclera: Conjunctivae normal.  Neck:     Thyroid: No thyromegaly.     Vascular: No carotid bruit.  Cardiovascular:     Rate and Rhythm: Normal rate and regular rhythm.     Pulses: Normal pulses.     Heart sounds: Normal heart sounds.   Pulmonary:     Effort: Pulmonary effort is normal. No respiratory distress.     Breath sounds: No wheezing.  Abdominal:     General: Bowel sounds are normal.     Palpations: Abdomen is soft.     Tenderness: There is no abdominal tenderness.  Musculoskeletal:     Cervical back: Normal range of motion. No erythema.     Right lower leg: No edema.     Left lower leg: No edema.  Lymphadenopathy:     Cervical: No cervical adenopathy.  Skin:    General: Skin is warm and dry.     Findings: No rash.  Neurological:     Mental Status: She is alert and oriented to person, place, and time.     Cranial Nerves: No cranial nerve deficit.     Sensory: No sensory deficit.     Deep Tendon Reflexes: Reflexes are normal and symmetric.  Psychiatric:        Attention and Perception: Attention normal.        Mood and Affect: Mood normal.    Diabetic Foot Exam - Simple   Simple Foot Form Diabetic Foot exam was performed with the following findings: Yes 01/25/2024  9:52 AM  Visual Inspection No deformities, no ulcerations, no other skin breakdown bilaterally: Yes Sensation Testing Intact to touch and monofilament testing bilaterally: Yes Pulse Check Posterior Tibialis and Dorsalis pulse intact bilaterally: Yes Comments      Wt Readings from Last 3 Encounters:  01/25/24 202 lb 9.6 oz (91.9 kg)  06/29/23 207 lb (93.9 kg)  02/08/23 204 lb 9.6 oz (92.8 kg)  BP 118/78   Pulse 83   Ht 5' 5 (1.651 m)   Wt 202 lb 9.6 oz (91.9 kg)   SpO2 97%   BMI 33.71 kg/m   Assessment and Plan:  Problem List Items Addressed This Visit       Unprioritized   Benign essential HTN (Chronic)   Blood pressure is well controlled on lisinopril . No medication side effects noted. Plan to continue current medications.       Relevant Orders   CBC with Differential/Platelet   TSH   Depression, major, recurrent, in partial remission (HCC) (Chronic)   Clinically stable on Lexapro , Bupropion  and Buspar .    No SI or HI on evaluation. Plan to continue same medications for now.       Hyperlipidemia associated with type 2 diabetes mellitus (HCC) (Chronic)   LDL is  Lab Results  Component Value Date   LDLCALC 99 08/11/2022   Currently taking Crestor   No medication side effects or other concerns. Recommended LDL goal is < 70.       Relevant Orders   Lipid panel   Mild intermittent asthma, uncomplicated (Chronic)   Asthma symptoms well controlled on daily Wixela and PRN albuterol . Recommend Prevnar 20 vaccine.      Type II diabetes mellitus with complication (HCC) (Chronic)   Blood sugars have been stable.  No hypoglycemic events since last visit. Currently medications are MTF. Last visit medical regimen changes were none. Lab Results  Component Value Date   HGBA1C 6.6 (A) 06/29/2023          Relevant Orders   Comprehensive metabolic panel with GFR   Hemoglobin A1c   Microalbumin / creatinine urine ratio   Other Visit Diagnoses       Annual physical exam    -  Primary   Mammogram scheduled CRC screening up to date   Relevant Orders   CBC with Differential/Platelet   Comprehensive metabolic panel with GFR   Hemoglobin A1c   Lipid panel   Microalbumin / creatinine urine ratio   TSH     Long term current use of oral hypoglycemic drug         Encounter for screening mammogram for breast cancer       scheduled     Encounter for immunization       Relevant Orders   Varicella-zoster vaccine IM (Completed)       Return in about 4 months (around 05/27/2024) for DM, HTN.    Leita HILARIO Adie, MD [facility] Medicine [place]

## 2024-01-25 NOTE — Assessment & Plan Note (Signed)
 LDL is  Lab Results  Component Value Date   LDLCALC 99 08/11/2022   Currently taking Crestor   No medication side effects or other concerns. Recommended LDL goal is < 70.

## 2024-01-25 NOTE — Assessment & Plan Note (Addendum)
 Clinically stable on Lexapro , Bupropion  and Buspar .   No SI or HI on evaluation. Plan to continue same medications for now.

## 2024-01-25 NOTE — Assessment & Plan Note (Signed)
 Blood sugars have been stable.  No hypoglycemic events since last visit. Currently medications are MTF. Last visit medical regimen changes were none. Lab Results  Component Value Date   HGBA1C 6.6 (A) 06/29/2023

## 2024-01-25 NOTE — Patient Instructions (Signed)
Schedule your annual eye exam.

## 2024-01-25 NOTE — Assessment & Plan Note (Signed)
 Blood pressure is well controlled on lisinopril . No medication side effects noted. Plan to continue current medications.

## 2024-01-26 LAB — LIPID PANEL
Chol/HDL Ratio: 3.1 ratio (ref 0.0–4.4)
Cholesterol, Total: 153 mg/dL (ref 100–199)
HDL: 49 mg/dL (ref >39)
LDL Chol Calc (NIH): 73 mg/dL (ref 0–99)
Triglycerides: 185 mg/dL — ABNORMAL HIGH (ref 0–149)
VLDL Cholesterol Cal: 31 mg/dL (ref 5–40)

## 2024-01-26 LAB — CBC WITH DIFFERENTIAL/PLATELET
Basophils Absolute: 0 x10E3/uL (ref 0.0–0.2)
Basos: 0 %
EOS (ABSOLUTE): 0.4 x10E3/uL (ref 0.0–0.4)
Eos: 7 %
Hematocrit: 44.2 % (ref 34.0–46.6)
Hemoglobin: 14.4 g/dL (ref 11.1–15.9)
Immature Grans (Abs): 0 x10E3/uL (ref 0.0–0.1)
Immature Granulocytes: 0 %
Lymphocytes Absolute: 1.8 x10E3/uL (ref 0.7–3.1)
Lymphs: 31 %
MCH: 29.9 pg (ref 26.6–33.0)
MCHC: 32.6 g/dL (ref 31.5–35.7)
MCV: 92 fL (ref 79–97)
Monocytes Absolute: 0.4 x10E3/uL (ref 0.1–0.9)
Monocytes: 7 %
Neutrophils Absolute: 3.3 x10E3/uL (ref 1.4–7.0)
Neutrophils: 55 %
Platelets: 272 x10E3/uL (ref 150–450)
RBC: 4.82 x10E6/uL (ref 3.77–5.28)
RDW: 12.9 % (ref 11.7–15.4)
WBC: 6 x10E3/uL (ref 3.4–10.8)

## 2024-01-26 LAB — TSH: TSH: 2.82 u[IU]/mL (ref 0.450–4.500)

## 2024-01-26 LAB — COMPREHENSIVE METABOLIC PANEL WITH GFR
ALT: 16 IU/L (ref 0–32)
AST: 17 IU/L (ref 0–40)
Albumin: 4.4 g/dL (ref 3.8–4.9)
Alkaline Phosphatase: 78 IU/L (ref 44–121)
BUN/Creatinine Ratio: 27 — ABNORMAL HIGH (ref 9–23)
BUN: 17 mg/dL (ref 6–24)
Bilirubin Total: 0.5 mg/dL (ref 0.0–1.2)
CO2: 23 mmol/L (ref 20–29)
Calcium: 10.3 mg/dL — ABNORMAL HIGH (ref 8.7–10.2)
Chloride: 101 mmol/L (ref 96–106)
Creatinine, Ser: 0.64 mg/dL (ref 0.57–1.00)
Globulin, Total: 2.2 g/dL (ref 1.5–4.5)
Glucose: 127 mg/dL — ABNORMAL HIGH (ref 70–99)
Potassium: 4.4 mmol/L (ref 3.5–5.2)
Sodium: 141 mmol/L (ref 134–144)
Total Protein: 6.6 g/dL (ref 6.0–8.5)
eGFR: 102 mL/min/1.73 (ref >59)

## 2024-01-26 LAB — HEMOGLOBIN A1C
Est. average glucose Bld gHb Est-mCnc: 143 mg/dL
Hgb A1c MFr Bld: 6.6 % — ABNORMAL HIGH (ref 4.8–5.6)

## 2024-01-26 LAB — MICROALBUMIN / CREATININE URINE RATIO
Creatinine, Urine: 105.6 mg/dL
Microalb/Creat Ratio: 11 mg/g{creat} (ref 0–29)
Microalbumin, Urine: 11.4 ug/mL

## 2024-01-27 ENCOUNTER — Ambulatory Visit: Payer: Self-pay | Admitting: Internal Medicine

## 2024-02-09 ENCOUNTER — Other Ambulatory Visit: Payer: Self-pay | Admitting: Internal Medicine

## 2024-02-09 DIAGNOSIS — J4520 Mild intermittent asthma, uncomplicated: Secondary | ICD-10-CM

## 2024-02-09 DIAGNOSIS — E118 Type 2 diabetes mellitus with unspecified complications: Secondary | ICD-10-CM

## 2024-02-09 DIAGNOSIS — I10 Essential (primary) hypertension: Secondary | ICD-10-CM

## 2024-02-13 NOTE — Telephone Encounter (Signed)
 Requested Prescriptions  Pending Prescriptions Disp Refills   montelukast  (SINGULAIR ) 10 MG tablet [Pharmacy Med Name: MONTELUKAST  10MG  TABLETS] 90 tablet 0    Sig: TAKE 1 TABLET(10 MG) BY MOUTH AT BEDTIME     Pulmonology:  Leukotriene Inhibitors Passed - 02/13/2024  8:46 AM      Passed - Valid encounter within last 12 months    Recent Outpatient Visits           2 weeks ago Annual physical exam   [facility], Leita DEL, MD               metFORMIN  (GLUCOPHAGE -XR) 500 MG 24 hr tablet [Pharmacy Med Name: METFORMIN  ER 500MG  24HR TABS] 90 tablet 0    Sig: TAKE 1 TABLET(500 MG) BY MOUTH DAILY WITH BREAKFAST     Endocrinology:  Diabetes - Biguanides Failed - 02/13/2024  8:46 AM      Failed - B12 Level in normal range and within 720 days    No results found for: VITAMINB12       Passed - Cr in normal range and within 360 days    Creatinine, Ser  Date Value Ref Range Status  01/25/2024 0.64 0.57 - 1.00 mg/dL Final         Passed - HBA1C is between 0 and 7.9 and within 180 days    Hgb A1c MFr Bld  Date Value Ref Range Status  01/25/2024 6.6 (H) 4.8 - 5.6 % Final    Comment:             Prediabetes: 5.7 - 6.4          Diabetes: >6.4          Glycemic control for adults with diabetes: <7.0          Passed - eGFR in normal range and within 360 days    GFR calc Af Amer  Date Value Ref Range Status  12/30/2019 105 >59 mL/min/1.73 Final    Comment:    **[facility] currently reports eGFR in compliance with the current**   recommendations of the [employer]. [facility] will   update reporting as new guidelines are published from the NKF-ASN   Task force.    GFR calc non Af Amer  Date Value Ref Range Status  12/30/2019 91 >59 mL/min/1.73 Final   eGFR  Date Value Ref Range Status  01/25/2024 102 >59 mL/min/1.73 Final         Passed - Valid encounter within last 6 months    Recent Outpatient Visits            2 weeks ago Annual physical exam   [facility], Leita DEL, MD              Passed - CBC within normal limits and completed in the last 12 months    WBC  Date Value Ref Range Status  01/25/2024 6.0 3.4 - 10.8 x10E3/uL Final   RBC  Date Value Ref Range Status  01/25/2024 4.82 3.77 - 5.28 x10E6/uL Final  11/16/2020 5 4.04 - 5.48 M/uL Final   Hemoglobin  Date Value Ref Range Status  01/25/2024 14.4 11.1 - 15.9 g/dL Final   Hematocrit  Date Value Ref Range Status  01/25/2024 44.2 34.0 - 46.6 % Final   MCHC  Date Value Ref Range Status  01/25/2024 32.6 31.5 - 35.7 g/dL Final  [facility]  Date Value Ref Range Status  01/25/2024 29.9 26.6 - 33.0 pg Final   MCV  Date Value Ref Range Status  01/25/2024 92 79 - 97 fL Final   No results found for: PLTCOUNTKUC, LABPLAT, POCPLA RDW  Date Value Ref Range Status  01/25/2024 12.9 11.7 - 15.4 % Final          lisinopril  (ZESTRIL ) 10 MG tablet [Pharmacy Med Name: LISINOPRIL  10MG  TABLETS] 90 tablet 0    Sig: TAKE 1 TABLET(10 MG) BY MOUTH DAILY     Cardiovascular:  ACE Inhibitors Passed - 02/13/2024  8:46 AM      Passed - Cr in normal range and within 180 days    Creatinine, Ser  Date Value Ref Range Status  01/25/2024 0.64 0.57 - 1.00 mg/dL Final         Passed - K in normal range and within 180 days    Potassium  Date Value Ref Range Status  01/25/2024 4.4 3.5 - 5.2 mmol/L Final         Passed - Patient is not pregnant      Passed - Last BP in normal range    BP Readings from Last 1 Encounters:  01/25/24 118/78         Passed - Valid encounter within last 6 months    Recent Outpatient Visits           2 weeks ago Annual physical exam   [facility] at [facility], Leita DEL, MD

## 2024-02-14 ENCOUNTER — Ambulatory Visit
Admission: RE | Admit: 2024-02-14 | Discharge: 2024-02-14 | Disposition: A | Discharge status: Home / Self Care | Source: Ambulatory Visit | Attending: Internal Medicine | Admitting: Internal Medicine

## 2024-02-14 DIAGNOSIS — Z1231 Encounter for screening mammogram for malignant neoplasm of breast: Secondary | ICD-10-CM | POA: Insufficient documentation

## 2024-02-23 ENCOUNTER — Other Ambulatory Visit: Payer: Self-pay | Admitting: Internal Medicine

## 2024-02-23 DIAGNOSIS — F3341 Major depressive disorder, recurrent, in partial remission: Secondary | ICD-10-CM

## 2024-02-25 NOTE — Telephone Encounter (Signed)
 Requested Prescriptions  Pending Prescriptions Disp Refills   busPIRone  (BUSPAR ) 10 MG tablet [Pharmacy Med Name: BUSPIRONE  10MG  TABLETS] 180 tablet 1    Sig: TAKE 1 TABLET(10 MG) BY MOUTH TWICE DAILY     Psychiatry: Anxiolytics/Hypnotics - Non-controlled Passed - 02/25/2024  4:12 PM      Passed - Valid encounter within last 12 months    Recent Outpatient Visits           1 month ago Annual physical exam   [facility] at [facility], Leita DEL, MD

## 2024-05-27 ENCOUNTER — Ambulatory Visit: Admitting: Internal Medicine

## 2024-05-27 ENCOUNTER — Encounter: Payer: Self-pay | Admitting: Internal Medicine

## 2024-05-27 VITALS — BP 136/82 | HR 87 | Ht 65.0 in | Wt 205.0 lb

## 2024-05-27 DIAGNOSIS — Z23 Encounter for immunization: Secondary | ICD-10-CM | POA: Diagnosis not present

## 2024-05-27 DIAGNOSIS — J4520 Mild intermittent asthma, uncomplicated: Secondary | ICD-10-CM

## 2024-05-27 DIAGNOSIS — I10 Essential (primary) hypertension: Secondary | ICD-10-CM

## 2024-05-27 DIAGNOSIS — Z7984 Long term (current) use of oral hypoglycemic drugs: Secondary | ICD-10-CM

## 2024-05-27 DIAGNOSIS — F411 Generalized anxiety disorder: Secondary | ICD-10-CM

## 2024-05-27 DIAGNOSIS — E118 Type 2 diabetes mellitus with unspecified complications: Secondary | ICD-10-CM | POA: Diagnosis not present

## 2024-05-27 DIAGNOSIS — F3341 Major depressive disorder, recurrent, in partial remission: Secondary | ICD-10-CM

## 2024-05-27 DIAGNOSIS — E785 Hyperlipidemia, unspecified: Secondary | ICD-10-CM

## 2024-05-27 DIAGNOSIS — E1169 Type 2 diabetes mellitus with other specified complication: Secondary | ICD-10-CM

## 2024-05-27 LAB — POCT GLYCOSYLATED HEMOGLOBIN (HGB A1C): Hemoglobin A1C: 6 % — AB (ref 4.0–5.6)

## 2024-05-27 MED ORDER — ROSUVASTATIN CALCIUM 10 MG PO TABS: 10.0000 mg | ORAL_TABLET | Freq: Every day | ORAL | 1 refills | Status: AC

## 2024-05-27 MED ORDER — BUSPIRONE HCL 10 MG PO TABS
10.0000 mg | ORAL_TABLET | Freq: Two times a day (BID) | ORAL | 1 refills | Status: AC
Start: 2024-05-27 — End: ?

## 2024-05-27 MED ORDER — MONTELUKAST SODIUM 10 MG PO TABS: 10.0000 mg | ORAL_TABLET | Freq: Every day | ORAL | 1 refills | Status: AC

## 2024-05-27 MED ORDER — METFORMIN HCL ER 500 MG PO TB24: 500.0000 mg | ORAL_TABLET | Freq: Every day | ORAL | 1 refills | Status: AC

## 2024-05-27 MED ORDER — ALPRAZOLAM 0.25 MG PO TABS: 0.2500 mg | ORAL_TABLET | Freq: Every day | ORAL | 0 refills | Status: AC | PRN

## 2024-05-27 MED ORDER — LISINOPRIL 10 MG PO TABS: 10.0000 mg | ORAL_TABLET | Freq: Every day | ORAL | 1 refills | Status: AC

## 2024-05-27 MED ORDER — BUPROPION HCL ER (XL) 300 MG PO TB24: 300.0000 mg | ORAL_TABLET | Freq: Every day | ORAL | 1 refills | Status: AC

## 2024-05-27 MED ORDER — ESCITALOPRAM OXALATE 20 MG PO TABS: 20.0000 mg | ORAL_TABLET | Freq: Every day | ORAL | 1 refills | Status: AC

## 2024-05-27 NOTE — Assessment & Plan Note (Addendum)
 BP slightly high today - home readings < 130/90 Current regimen is lisinopril . No medication side effects noted. Will continue the same regimen.

## 2024-05-27 NOTE — Assessment & Plan Note (Signed)
 Taking Buspar  with some benefit. Also uses Xanax  prn severe anxiety or insomnia Using about 60 tabs every 6 months - will refill today.

## 2024-05-27 NOTE — Assessment & Plan Note (Addendum)
 Symptoms are stable on triple therapy: Lexapro , Bupropion  and Buspar . She is satisfied to continue the same regimen. She takes Xanax  almost daily due to work stresses.

## 2024-05-27 NOTE — Progress Notes (Signed)
 Date:  05/27/2024   Name:  Amanda Bautista   DOB:  April 12, 1966   MRN:  [id number]   Chief Complaint: Diabetes and Hypertension  Hypertension This is a chronic problem. The problem is controlled. Associated symptoms include anxiety. Pertinent negatives include no chest pain, headaches, palpitations or shortness of breath. Past treatments include ACE inhibitors. The current treatment provides significant improvement.  Diabetes She presents for her follow-up diabetic visit. She has type 2 diabetes mellitus. Her disease course has been stable. Hypoglycemia symptoms include nervousness/anxiousness. Pertinent negatives for hypoglycemia include no dizziness or headaches. Pertinent negatives for diabetes include no chest pain, no fatigue and no weakness. Current diabetic treatment includes oral agent (monotherapy) (MTF). She is compliant with treatment all of the time.  Anxiety Presents for follow-up visit. Symptoms include nervous/anxious behavior, panic and restlessness. Patient reports no chest pain, dizziness, palpitations or shortness of breath. Symptoms occur occasionally. The severity of symptoms is moderate. The quality of sleep is fair.      Review of Systems  Constitutional:  Negative for fatigue and unexpected weight change.  HENT:  Negative for trouble swallowing.   Eyes:  Negative for visual disturbance.  Respiratory:  Negative for cough, chest tightness, shortness of breath and wheezing.   Cardiovascular:  Negative for chest pain, palpitations and leg swelling.  Gastrointestinal:  Negative for abdominal pain, constipation and diarrhea.  Musculoskeletal:  Negative for arthralgias and myalgias.  Neurological:  Negative for dizziness, weakness, light-headedness and headaches.  Psychiatric/Behavioral:  The patient is nervous/anxious.      Lab Results  Component Value Date   NA 141 01/25/2024   K 4.4 01/25/2024   CO2 23 01/25/2024   GLUCOSE 127 (H) 01/25/2024   BUN 17 01/25/2024    CREATININE 0.64 01/25/2024   CALCIUM  10.3 (H) 01/25/2024   EGFR 102 01/25/2024   GFRNONAA 91 12/30/2019   Lab Results  Component Value Date   CHOL 153 01/25/2024   HDL 49 01/25/2024   LDLCALC 73 01/25/2024   TRIG 185 (H) 01/25/2024   CHOLHDL 3.1 01/25/2024   Lab Results  Component Value Date   TSH 2.820 01/25/2024   Lab Results  Component Value Date   HGBA1C 6.0 (A) 05/27/2024   Lab Results  Component Value Date   WBC 6.0 01/25/2024   HGB 14.4 01/25/2024   HCT 44.2 01/25/2024   MCV 92 01/25/2024   PLT 272 01/25/2024   Lab Results  Component Value Date   ALT 16 01/25/2024   AST 17 01/25/2024   ALKPHOS 78 01/25/2024   BILITOT 0.5 01/25/2024   No results found for: MARIEN BOLLS, VD25OH   Patient Active Problem List   Diagnosis Date Noted   Polyp of sigmoid colon 02/08/2023   S/P TAH-BSO 06/27/2020   Xerosis of skin 11/11/2018   Hypertensive retinopathy 05/06/2018   Type II diabetes mellitus with complication (HCC) 04/15/2018   Benign essential HTN 04/15/2018   Hyperlipidemia associated with type 2 diabetes mellitus (HCC) 04/15/2018   Depression, major, recurrent, 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/15/2018   Generalized anxiety disorder 04/15/2018   Mild intermittent asthma, uncomplicated 04/15/2018    No Known Allergies  Past Surgical History:  Procedure Laterality Date   COLONOSCOPY WITH PROPOFOL  N/A 02/08/2023   Procedure: COLONOSCOPY WITH PROPOFOL ;  Surgeon: Jinny Carmine, MD;  Location: [facility];  Service: Endoscopy;  Laterality: N/A;   OOPHORECTOMY     one ovary left   POLYPECTOMY  02/08/2023   Procedure: POLYPECTOMY;  Surgeon: Jinny,  Darren, MD;  Location: [facility];  Service: Endoscopy;;   TOTAL ABDOMINAL HYSTERECTOMY      Social History   Tobacco Use   Smoking status: Former    Current packs/day: 0.00    Average packs/day: 0.5 packs/day for 10.0 years (5.0 ttl pk-yrs)    Types: Cigarettes    Quit date: 10/18/2005     Years since quitting: 18.6   Smokeless tobacco: Never   Tobacco comments:    On and off depending on stress in life  Vaping Use   Vaping status: Never Used  Substance Use Topics   Alcohol use: Not Currently    Comment: I have not had alcohol in over 10 years   Drug use: Not Currently    Types: Marijuana     Medication list has been reviewed and updated.  Current Meds  Medication Sig   albuterol  (VENTOLIN  HFA) 108 (90 Base) MCG/ACT inhaler Inhale 2 puffs into the lungs every 6 (six) hours as needed for wheezing or shortness of breath.   BIOTIN PO Take by mouth.   CALCIUM  PO Take by mouth.   fluticasone -salmeterol (WIXELA INHUB) 250-50 MCG/ACT AEPB Inhale 1 puff into the lungs 2 (two) times daily. in the morning and at bedtime.   L-Methylfolate-B6-B12 (VITACIRC-B PO) Take by mouth.   meloxicam (MOBIC) 15 MG tablet Take 15 mg by mouth daily.   triamcinolone  cream (KENALOG ) 0.1 % Apply 1 application. topically 2 (two) times daily.   [DISCONTINUED] ALPRAZolam  (XANAX ) 0.25 MG tablet Take 1 tablet (0.25 mg total) by mouth daily as needed for anxiety.   [DISCONTINUED] buPROPion  (WELLBUTRIN  XL) 300 MG 24 hr tablet TAKE 1 TABLET BY MOUTH DAILY   [DISCONTINUED] busPIRone  (BUSPAR ) 10 MG tablet TAKE 1 TABLET(10 MG) BY MOUTH TWICE DAILY   [DISCONTINUED] escitalopram  (LEXAPRO ) 20 MG tablet TAKE 1 TABLET(20 MG) BY MOUTH DAILY   [DISCONTINUED] lisinopril  (ZESTRIL ) 10 MG tablet TAKE 1 TABLET(10 MG) BY MOUTH DAILY   [DISCONTINUED] metFORMIN  (GLUCOPHAGE -XR) 500 MG 24 hr tablet TAKE 1 TABLET(500 MG) BY MOUTH DAILY WITH BREAKFAST   [DISCONTINUED] montelukast  (SINGULAIR ) 10 MG tablet TAKE 1 TABLET(10 MG) BY MOUTH AT BEDTIME   [DISCONTINUED] rosuvastatin  (CRESTOR ) 10 MG tablet Take 1 tablet (10 mg total) by mouth daily.       05/27/2024    9:27 AM 01/25/2024    9:40 AM 06/29/2023    2:30 PM 12/25/2022    9:49 AM  GAD 7 : Generalized Anxiety Score  Nervous, Anxious, on Edge 2 2 2 1   Control/stop  worrying 1 1 0 0  Worry too much - different things 2 2 1 1   Trouble relaxing 1 1 1 1   Restless 2 2 1 1   Easily annoyed or irritable 0 0 0 0  Afraid - awful might happen 0 0 0 0  Total GAD 7 Score 8 8 5 4   Anxiety Difficulty Somewhat difficult Somewhat difficult Somewhat difficult Not difficult at all       05/27/2024    9:26 AM 01/25/2024    9:39 AM 06/29/2023    2:29 PM  Depression screen PHQ 2/9  Decreased Interest 1 1 1   Down, Depressed, Hopeless 2 2 1   PHQ - 2 Score 3 3 2   Altered sleeping 3 3 1   Tired, decreased energy 1 1 1   Change in appetite 2 2 1   Feeling bad or failure about yourself  0 0 1  Trouble concentrating 1 1 1   Moving slowly or fidgety/restless 0 1  1  Suicidal thoughts 0 0 0  PHQ-9 Score 10 11  8    Difficult doing work/chores Not difficult at all Somewhat difficult Somewhat difficult     Data saved with a previous flowsheet row definition    BP Readings from Last 3 Encounters:  05/27/24 136/82  01/25/24 118/78  06/29/23 110/82    Physical Exam Vitals and nursing note reviewed.  Constitutional:      General: She is not in acute distress.    Appearance: Normal appearance. She is well-developed.  HENT:     Head: Normocephalic and atraumatic.  Cardiovascular:     Rate and Rhythm: Normal rate and regular rhythm.  Pulmonary:     Effort: Pulmonary effort is normal. No respiratory distress.     Breath sounds: No wheezing or rhonchi.  Musculoskeletal:     Cervical back: Normal range of motion.  Lymphadenopathy:     Cervical: No cervical adenopathy.  Skin:    General: Skin is warm and dry.     Findings: No rash.  Neurological:     General: No focal deficit present.     Mental Status: She is alert and oriented to person, place, and time.  Psychiatric:        Mood and Affect: Mood normal.        Behavior: Behavior normal.     Wt Readings from Last 3 Encounters:  05/27/24 205 lb (93 kg)  01/25/24 202 lb 9.6 oz (91.9 kg)  06/29/23 207 lb (93.9  kg)    BP 136/82   Pulse 87   Ht 5' 5 (1.651 m)   Wt 205 lb (93 kg)   SpO2 96%   BMI 34.11 kg/m   Assessment and Plan:  Problem List Items Addressed This Visit       Unprioritized   Type II diabetes mellitus with complication (HCC) - Primary (Chronic)   Currently medications are MTF.  No hypoglycemic episodes noted. Last visit medical regimen changes were none. Lab Results  Component Value Date   HGBA1C 6.6 (H) 01/25/2024  A1C today =  6.0.  No medication changes recommended.       Relevant Medications   metFORMIN  (GLUCOPHAGE -XR) 500 MG 24 hr tablet   rosuvastatin  (CRESTOR ) 10 MG tablet   lisinopril  (ZESTRIL ) 10 MG tablet   Other Relevant Orders   POCT glycosylated hemoglobin (Hb A1C) (Completed)   Benign essential HTN (Chronic)   BP slightly high today - home readings < 130/90 Current regimen is lisinopril . No medication side effects noted. Will continue the same regimen.        Relevant Medications   rosuvastatin  (CRESTOR ) 10 MG tablet   lisinopril  (ZESTRIL ) 10 MG tablet   Hyperlipidemia associated with type 2 diabetes mellitus (HCC) (Chronic)   Relevant Medications   metFORMIN  (GLUCOPHAGE -XR) 500 MG 24 hr tablet   rosuvastatin  (CRESTOR ) 10 MG tablet   lisinopril  (ZESTRIL ) 10 MG tablet   Depression, major, recurrent, in partial remission (Chronic)   Symptoms are stable on triple therapy: Lexapro , Bupropion  and Buspar . She is satisfied to continue the same regimen. She takes Xanax  almost daily due to work stresses.      Relevant Medications   escitalopram  (LEXAPRO ) 20 MG tablet   busPIRone  (BUSPAR ) 10 MG tablet   buPROPion  (WELLBUTRIN  XL) 300 MG 24 hr tablet   ALPRAZolam  (XANAX ) 0.25 MG tablet   Generalized anxiety disorder   Taking Buspar  with some benefit. Also uses Xanax  prn severe anxiety or insomnia Using about 60 tabs  every 6 months - will refill today.      Relevant Medications   escitalopram  (LEXAPRO ) 20 MG tablet   busPIRone  (BUSPAR ) 10  MG tablet   buPROPion  (WELLBUTRIN  XL) 300 MG 24 hr tablet   ALPRAZolam  (XANAX ) 0.25 MG tablet   Mild intermittent asthma, uncomplicated (Chronic)   Relevant Medications   montelukast  (SINGULAIR ) 10 MG tablet   Other Visit Diagnoses       Long term current use of oral hypoglycemic drug         Encounter for immunization       Relevant Orders   Varicella-zoster vaccine IM (Completed)       No follow-ups on file.    Leita HILARIO Adie, MD [facility] [place]

## 2024-05-27 NOTE — Assessment & Plan Note (Addendum)
 Currently medications are MTF.  No hypoglycemic episodes noted. Last visit medical regimen changes were none. Lab Results  Component Value Date   HGBA1C 6.6 (H) 01/25/2024  A1C today =  6.0.  No medication changes recommended.

## 2024-09-22 ENCOUNTER — Ambulatory Visit: Admitting: Student
